# Patient Record
Sex: Female | Born: 1995 | Race: Black or African American | Hispanic: No | Marital: Single | Smoking: Never smoker
Health system: Southern US, Community
[De-identification: ages and names within clinical notes are randomized; demographics above are authoritative.]

## PROBLEM LIST (undated history)

## (undated) DIAGNOSIS — N83209 Unspecified ovarian cyst, unspecified side: Secondary | ICD-10-CM

## (undated) HISTORY — PX: NO PAST SURGERIES: SHX2092

---

## 2020-09-25 ENCOUNTER — Other Ambulatory Visit: Payer: Self-pay

## 2020-09-25 ENCOUNTER — Emergency Department
Admission: EM | Admit: 2020-09-25 | Discharge: 2020-09-25 | Disposition: A | Discharge status: Home / Self Care | Payer: Medicaid - Out of State | Attending: Emergency Medicine | Admitting: Emergency Medicine

## 2020-09-25 ENCOUNTER — Emergency Department: Payer: Medicaid - Out of State

## 2020-09-25 ENCOUNTER — Encounter: Payer: Self-pay | Admitting: Emergency Medicine

## 2020-09-25 DIAGNOSIS — R1031 Right lower quadrant pain: Secondary | ICD-10-CM | POA: Insufficient documentation

## 2020-09-25 DIAGNOSIS — O26892 Other specified pregnancy related conditions, second trimester: Secondary | ICD-10-CM | POA: Diagnosis present

## 2020-09-25 DIAGNOSIS — N898 Other specified noninflammatory disorders of vagina: Secondary | ICD-10-CM | POA: Diagnosis not present

## 2020-09-25 DIAGNOSIS — N939 Abnormal uterine and vaginal bleeding, unspecified: Secondary | ICD-10-CM

## 2020-09-25 DIAGNOSIS — Z3A08 8 weeks gestation of pregnancy: Secondary | ICD-10-CM | POA: Diagnosis not present

## 2020-09-25 DIAGNOSIS — R197 Diarrhea, unspecified: Secondary | ICD-10-CM | POA: Diagnosis not present

## 2020-09-25 DIAGNOSIS — Z3492 Encounter for supervision of normal pregnancy, unspecified, second trimester: Secondary | ICD-10-CM

## 2020-09-25 DIAGNOSIS — R102 Pelvic and perineal pain: Secondary | ICD-10-CM

## 2020-09-25 LAB — URINALYSIS, COMPLETE (UACMP) WITH MICROSCOPIC
Bacteria, UA: NONE SEEN
Bilirubin Urine: NEGATIVE
Glucose, UA: NEGATIVE mg/dL
Hgb urine dipstick: NEGATIVE
Ketones, ur: NEGATIVE mg/dL
Leukocytes,Ua: NEGATIVE
Nitrite: NEGATIVE
Protein, ur: NEGATIVE mg/dL
Specific Gravity, Urine: 1.02 (ref 1.005–1.030)
pH: 8 (ref 5.0–8.0)

## 2020-09-25 LAB — COMPREHENSIVE METABOLIC PANEL
ALT: 14 U/L (ref 0–44)
AST: 20 U/L (ref 15–41)
Albumin: 3.5 g/dL (ref 3.5–5.0)
Alkaline Phosphatase: 41 U/L (ref 38–126)
Anion gap: 9 (ref 5–15)
BUN: 7 mg/dL (ref 6–20)
CO2: 22 mmol/L (ref 22–32)
Calcium: 9.1 mg/dL (ref 8.9–10.3)
Chloride: 103 mmol/L (ref 98–111)
Creatinine, Ser: 0.36 mg/dL — ABNORMAL LOW (ref 0.44–1.00)
GFR, Estimated: >60 mL/min (ref >60)
Glucose, Bld: 94 mg/dL (ref 70–99)
Potassium: 3.7 mmol/L (ref 3.5–5.1)
Sodium: 134 mmol/L — ABNORMAL LOW (ref 135–145)
Total Bilirubin: 0.6 mg/dL (ref 0.3–1.2)
Total Protein: 6.8 g/dL (ref 6.5–8.1)

## 2020-09-25 LAB — CBC
HCT: 38.2 % (ref 36.0–46.0)
Hemoglobin: 13.2 g/dL (ref 12.0–15.0)
MCH: 29.7 pg (ref 26.0–34.0)
MCHC: 34.6 g/dL (ref 30.0–36.0)
MCV: 85.8 fL (ref 80.0–100.0)
Platelets: 194 10*3/uL (ref 150–400)
RBC: 4.45 MIL/uL (ref 3.87–5.11)
RDW: 13.2 % (ref 11.5–15.5)
WBC: 8.4 10*3/uL (ref 4.0–10.5)
nRBC: 0 % (ref 0.0–0.2)

## 2020-09-25 LAB — ABO/RH: ABO/RH(D): A POS

## 2020-09-25 LAB — HCG, QUANTITATIVE, PREGNANCY: hCG, Beta Chain, Quant, S: 34186 m[IU]/mL — ABNORMAL HIGH (ref <5)

## 2020-09-25 LAB — LIPASE, BLOOD: Lipase: 27 U/L (ref 11–51)

## 2020-09-25 NOTE — ED Triage Notes (Signed)
Pt presents to ED via POV with c/o RLQ abdominal pain, vaginal spotting, and diarrhea. Pt states is currently pregnant, due 02/26/21 approx [redacted] weeks pregnant. Pt states symptoms started at approx 0600 this morning, abdominal cramping x several days. Pt states is G3A2.

## 2020-09-25 NOTE — ED Provider Notes (Addendum)
[facility] Emergency Department Provider Note  ____________________________________________  Time seen: Approximately 3:01 PM  I have reviewed the triage vital signs and the nursing notes.   HISTORY  Chief Complaint Abdominal Pain and Diarrhea    HPI Angelica Mason is a 24 y.o. female that is [redacted] weeks pregnant that presents to the emergency department for evaluation of diarrhea, vaginal spotting, and right lower quadrant cramping today. States that she has had 5-6 back-to-back loose diarrhea today.  She had one episode of vaginal spotting this morning.  She has not noticed any vaginal spotting this afternoon.  She has had some right side cramping.  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  She moved here last week from [place] and has not yet established with OB here.  She has had routine follow-up with her OB in [place] prior to moving.  She has had no complications with this pregnancy. No fever, nausea, vomiting.   History reviewed. No pertinent past medical history.  There are no problems to display for this patient.   History reviewed. No pertinent surgical history.  Prior to Admission medications   Not on File    Allergies Toradol [ketorolac tromethamine]  No family history on file.  Social History Social History   Tobacco Use  . Smoking status: Not on file  . Smokeless tobacco: Never Used  Substance Use Topics  . Alcohol use: Not Currently  . Drug use: Not Currently     Review of Systems  Constitutional: No fever/chills ENT: No upper respiratory complaints. Cardiovascular: No chest pain. Respiratory: No cough. No SOB. Gastrointestinal: Positive for abdominal pain. No nausea, no vomiting.  Musculoskeletal: Negative for musculoskeletal pain. Skin: Negative for rash, abrasions, lacerations, ecchymosis. Neurological: Negative for headaches, numbness or tingling   ____________________________________________   PHYSICAL  EXAM:  VITAL SIGNS: ED Triage Vitals  Enc Vitals Group     BP 09/25/20 1225 122/67     Pulse Rate 09/25/20 1225 81     Resp 09/25/20 1225 20     Temp 09/25/20 1225 97.9 F (36.6 C)     Temp src --      SpO2 09/25/20 1225 100 %     Weight 09/25/20 1226 156 lb (70.8 kg)     Height 09/25/20 1226 5\' 3"  (1.6 m)     Head Circumference --      Peak Flow --      Pain Score 09/25/20 1226 8     Pain Loc --      Pain Edu? --      Excl. in GC? --      Constitutional: Alert and oriented. Well appearing and in no acute distress. Eyes: Conjunctivae are normal. PERRL. EOMI. Head: Atraumatic. ENT:      Ears:      Nose: No congestion/rhinnorhea.      Mouth/Throat: Mucous membranes are moist.  Neck: No stridor.   Cardiovascular: Normal rate, regular rhythm.  Good peripheral circulation. Respiratory: Normal respiratory effort without tachypnea or retractions. Lungs CTAB. Good air entry to the bases with no decreased or absent breath sounds. Gastrointestinal: Bowel sounds 4 quadrants. Soft and nontender to palpation. No guarding or rigidity. No palpable masses. No distention.  Musculoskeletal: Full range of motion to all extremities. No gross deformities appreciated. Neurologic:  Normal speech and language. No gross focal neurologic deficits are appreciated.  Skin:  Skin is warm, dry and intact. No rash noted. Psychiatric: Mood and affect are normal. Speech and behavior  are normal. Patient exhibits appropriate insight and judgement.   ____________________________________________   LABS (all labs ordered are listed, but only abnormal results are displayed)  Labs Reviewed  COMPREHENSIVE METABOLIC PANEL - Abnormal; Notable for the following components:      Result Value   Sodium 134 (*)    Creatinine, Ser 0.36 (*)    All other components within normal limits  URINALYSIS, COMPLETE (UACMP) WITH MICROSCOPIC - Abnormal; Notable for the following components:   Color, Urine YELLOW (*)     APPearance CLOUDY (*)    All other components within normal limits  HCG, QUANTITATIVE, PREGNANCY - Abnormal; Notable for the following components:   hCG, Beta Chain, Quant, S 34,186 (*)    All other components within normal limits  LIPASE, BLOOD  CBC  ABO/RH   ____________________________________________  EKG   ____________________________________________  RADIOLOGY   US OB Limited  Result Date: 09/25/2020 CLINICAL DATA:  RIGHT lower quadrant pain and spotting for 1 week, in second trimester pregnancy with assigned gestational age of [redacted] weeks 0 days EXAM: LIMITED OBSTETRIC ULTRASOUND FINDINGS: Number of Fetuses: 1 Heart Rate:  144 bpm Movement: Yes Presentation: Variable Placental Location: Fundal to RIGHT Previa: None Amniotic Fluid (Subjective):  Within normal limits. BPD: 4.0 cm 18 w  1 d MATERNAL FINDINGS: Cervix:  Appears closed, 4.0 cm length. Uterus/Adnexae: No abnormality visualized. IMPRESSION: Single live intrauterine gestation as above. No acute abnormalities. This exam is performed on an emergent basis and does not comprehensively evaluate fetal size, dating, or anatomy; follow-up complete OB US should be considered if further fetal assessment is warranted. Electronically Signed   By: Ulyses Southward M.D.   On: 09/25/2020 16:16    ____________________________________________    PROCEDURES  Procedure(s) performed:    Procedures    Medications - No data to display   ____________________________________________   INITIAL IMPRESSION / ASSESSMENT AND PLAN / ED COURSE  Pertinent labs & imaging results that were available during my care of the patient were reviewed by me and considered in my medical decision making (see chart for details).  Review of the [place] CSRS was performed in accordance of the [facility] prior to dispensing any controlled drugs.   Patient presented the emergency department for evaluation of diarrhea, vaginal spotting, abdominal cramping during  pregnancy.  Vital signs and exam are reassuring.  Patient had one episode of vaginal spotting this morning and has not had any birth or vaginal spotting.  Her abdominal pain has significantly improved while in the emergency department.  Lab work is largely unremarkable.  Ultrasound negative for acute abnormalities per radiology.  Patient overall feels well now. Patient is to follow up with OB as directed.  OB referral was given.  Patient is given ED precautions to return to the ED for any worsening or new symptoms.   MARVELLE SPAN was evaluated in Emergency Department on 09/25/2020 for the symptoms described in the history of present illness. She was evaluated in the context of the global COVID-19 pandemic, which necessitated consideration that the patient might be at risk for infection with the SARS-CoV-2 virus that causes COVID-19. Institutional protocols and algorithms that pertain to the evaluation of patients at risk for COVID-19 are in a state of rapid change based on information released by regulatory bodies including the CDC and federal and state organizations. These policies and algorithms were followed during the patient's care in the ED.  ____________________________________________  FINAL CLINICAL IMPRESSION(S) / ED DIAGNOSES  Final diagnoses:  Right  lower quadrant abdominal pain  Second trimester pregnancy  Vaginal spotting      NEW MEDICATIONS STARTED DURING THIS VISIT:  ED Discharge Orders    None          This chart was dictated using voice recognition software/Dragon. Despite best efforts to proofread, errors can occur which can change the meaning. Any change was purely unintentional.    Enid Derry, PA-C 09/26/20 2300    Enid Derry, PA-C 09/26/20 2301    Gilles Chiquito, MD 09/27/20 [phone]

## 2020-10-25 ENCOUNTER — Other Ambulatory Visit: Payer: Self-pay

## 2020-10-25 ENCOUNTER — Ambulatory Visit: Payer: Self-pay | Admitting: Physician Assistant

## 2020-10-25 DIAGNOSIS — B373 Candidiasis of vulva and vagina: Secondary | ICD-10-CM

## 2020-10-25 DIAGNOSIS — Z113 Encounter for screening for infections with a predominantly sexual mode of transmission: Secondary | ICD-10-CM

## 2020-10-25 DIAGNOSIS — B3731 Acute candidiasis of vulva and vagina: Secondary | ICD-10-CM

## 2020-10-25 LAB — WET PREP FOR TRICH, YEAST, CLUE: Trichomonas Exam: NEGATIVE

## 2020-10-25 MED ORDER — CLOTRIMAZOLE 1 % VA CREA: 1.0000 | TOPICAL_CREAM | Freq: Every day | VAGINAL | 0 refills | Status: DC

## 2020-10-25 NOTE — Progress Notes (Signed)
Post:  RN reviewed wet mount with patient. Patient tx for yeast per C. Hampton PA orders.   Harvie Heck, RN

## 2020-10-26 ENCOUNTER — Encounter: Payer: Self-pay | Admitting: Physician Assistant

## 2020-10-26 NOTE — Progress Notes (Signed)
[facility] Department STI clinic/screening visit  Subjective:  Angelica Mason is a 24 y.o. female being seen today for an STI screening visit. The patient reports they do have symptoms.  Patient reports that they are currently [redacted] weeks pregnant.   They reported they are not interested in discussing contraception today.  Patient's last menstrual period was 05/22/2020.   Patient has the following medical conditions:  There are no problems to display for this patient.   Chief Complaint  Patient presents with  . SEXUALLY TRANSMITTED DISEASE    screening    HPI  Patient reports that she has had light greenish/yellowish discharge with itching for 2 weeks.  Denies chronic conditions and surgeries.  Reports that she recently moved here from [place] and is trying to get her Medicaid straightened out to start [facility].  Reports that she is taking her PNV as directed.  States her last HIV test was last month and last pap was in August.  Patient given resource list for [facility] providers in the area and declines to schedule at [facility] today.   See flowsheet for further details and programmatic requirements.    The following portions of the patient's history were reviewed and updated as appropriate: allergies, current medications, past medical history, past social history, past surgical history and problem list.  Objective:  There were no vitals filed for this visit.  Physical Exam Constitutional:      General: She is not in acute distress.    Appearance: Normal appearance.  HENT:     Head: Normocephalic and atraumatic.     Comments: No nits,lice, or hair loss. No cervical, supraclavicular or axillary adenopathy.    Mouth/Throat:     Mouth: Mucous membranes are moist.     Pharynx: Oropharynx is clear. No oropharyngeal exudate or posterior oropharyngeal erythema.  Eyes:     Conjunctiva/sclera: Conjunctivae normal.  Pulmonary:     Effort: Pulmonary effort is normal.  Abdominal:      Palpations: Abdomen is soft. There is no mass.     Tenderness: There is no abdominal tenderness. There is no guarding or rebound.  Genitourinary:    General: Normal vulva.     Rectum: Normal.     Comments: External genitalia/pubic area without nits, lice, edema, erythema, lesions and inguinal adenopathy. Vagina with normal mucosa and moderate amount of thick, clumping  discharge. Cervix without visible lesions. Uterus firm, mobile, nt, approximately 22-24 week size, no masses, no CMT, no adnexal tenderness or fullness. Musculoskeletal:     Cervical back: Neck supple. No tenderness.  Skin:    General: Skin is warm and dry.     Findings: No bruising, erythema, lesion or rash.  Neurological:     Mental Status: She is alert and oriented to person, place, and time.  Psychiatric:        Mood and Affect: Mood normal.        Behavior: Behavior normal.        Thought Content: Thought content normal.        Judgment: Judgment normal.      Assessment and Plan:  Angelica Mason is a 24 y.o. female presenting to the [facility] Department for STI screening  1. Screening for STD (sexually transmitted disease) Patient into clinic with symptoms. Patient declines blood work today. Rec condoms with all sex. Await test results.  Counseled that RN will call if needs to RTC for treatment once results are back. - WET PREP FOR TRICH,  YEAST, CLUE - Chlamydia/Gonorrhea [facility]  2. Vaginal candidiasis Will treat for yeast with Clotrimazole 1 % vaginal cream 1 app per vagina at hs for 7 days. No sex for 10 days. - clotrimazole (CLOTRIMAZOLE-7) 1 % vaginal cream; Place 1 Applicatorful vaginally at bedtime.  Dispense: 45 g; Refill: 0     No follow-ups on file.  No future appointments.  Matt Holmes, PA

## 2020-11-07 DIAGNOSIS — Z419 Encounter for procedure for purposes other than remedying health state, unspecified: Secondary | ICD-10-CM | POA: Diagnosis not present

## 2020-11-13 DIAGNOSIS — Z3482 Encounter for supervision of other normal pregnancy, second trimester: Secondary | ICD-10-CM | POA: Diagnosis not present

## 2020-11-13 DIAGNOSIS — N898 Other specified noninflammatory disorders of vagina: Secondary | ICD-10-CM | POA: Diagnosis not present

## 2020-11-20 DIAGNOSIS — Z3482 Encounter for supervision of other normal pregnancy, second trimester: Secondary | ICD-10-CM | POA: Diagnosis not present

## 2020-11-28 DIAGNOSIS — Z20822 Contact with and (suspected) exposure to covid-19: Secondary | ICD-10-CM | POA: Diagnosis not present

## 2020-12-04 DIAGNOSIS — Z3482 Encounter for supervision of other normal pregnancy, second trimester: Secondary | ICD-10-CM | POA: Diagnosis not present

## 2020-12-08 DIAGNOSIS — Z419 Encounter for procedure for purposes other than remedying health state, unspecified: Secondary | ICD-10-CM | POA: Diagnosis not present

## 2020-12-08 NOTE — L&D Delivery Note (Signed)
Delivery Summary for Angelica Mason  Labor Events:   Preterm labor: No data found  Rupture date: 02/25/2021  Rupture time: 9:50 PM  Rupture type: Spontaneous Intact Bulging bag of water  Fluid Color: Clear Yellow Light Meconium  Induction: No data found  Augmentation: No data found  Complications: No data found  Cervical ripening: No data found No data found   No data found     Delivery:   Episiotomy: No data found  Lacerations: No data found  Repair suture: No data found  Repair # of packets: No data found  Blood loss (ml): No data found   Information for the patient's newborn:  Angelica, Mason [[id number]]    Delivery 02/26/2021 5:56 AM by  C-Section, Low Transverse Sex:  female Gestational Age: [redacted]w[redacted]d Delivery Clinician:   Living?:         APGARS  One minute Five minutes Ten minutes  Skin color:        Heart rate:        Grimace:        Muscle tone:        Breathing:        Totals: 8  9      Presentation/position:      Resuscitation:   Cord information:    Disposition of cord blood:     Blood gases sent?  Complications:   Placenta: Delivered:       appearance Newborn Measurements: Weight: 7 lb 4.8 oz (3310 g)  Height: 21.65"  Head circumference:    Chest circumference:    Other providers:    Additional  information: Forceps:   Vacuum:   Breech:   Observed anomalies        See Dr. Oretha Milch operative note for details of C-section delivery.    Hildred Laser, MD [facility]

## 2020-12-18 DIAGNOSIS — Z20822 Contact with and (suspected) exposure to covid-19: Secondary | ICD-10-CM | POA: Diagnosis not present

## 2020-12-22 ENCOUNTER — Observation Stay
Admission: EM | Admit: 2020-12-22 | Discharge: 2020-12-22 | Disposition: A | Discharge status: Home / Self Care | Payer: Medicaid Other | Attending: Obstetrics and Gynecology | Admitting: Obstetrics and Gynecology

## 2020-12-22 ENCOUNTER — Other Ambulatory Visit: Payer: Self-pay

## 2020-12-22 DIAGNOSIS — R1031 Right lower quadrant pain: Secondary | ICD-10-CM | POA: Insufficient documentation

## 2020-12-22 DIAGNOSIS — Z6830 Body mass index (BMI) 30.0-30.9, adult: Secondary | ICD-10-CM | POA: Diagnosis not present

## 2020-12-22 DIAGNOSIS — Z3A30 30 weeks gestation of pregnancy: Secondary | ICD-10-CM | POA: Insufficient documentation

## 2020-12-22 DIAGNOSIS — O26893 Other specified pregnancy related conditions, third trimester: Secondary | ICD-10-CM | POA: Diagnosis not present

## 2020-12-22 DIAGNOSIS — O2621 Pregnancy care for patient with recurrent pregnancy loss, first trimester: Secondary | ICD-10-CM | POA: Diagnosis not present

## 2020-12-22 DIAGNOSIS — O99891 Other specified diseases and conditions complicating pregnancy: Secondary | ICD-10-CM | POA: Diagnosis not present

## 2020-12-22 HISTORY — DX: Unspecified ovarian cyst, unspecified side: N83.209

## 2020-12-22 MED ORDER — ACETAMINOPHEN 325 MG PO TABS
ORAL_TABLET | ORAL | Status: AC
  Filled 2020-12-22: qty 2

## 2020-12-22 MED ORDER — ACETAMINOPHEN 325 MG PO TABS
650.0000 mg | ORAL_TABLET | Freq: Four times a day (QID) | ORAL | Status: DC | PRN
  Administered 2020-12-22: 650 mg via ORAL

## 2020-12-22 MED ORDER — SIMETHICONE 80 MG PO CHEW
80.0000 mg | CHEWABLE_TABLET | Freq: Once | ORAL | Status: AC
  Administered 2020-12-22: 80 mg via ORAL

## 2020-12-22 MED ORDER — SIMETHICONE 80 MG PO CHEW
CHEWABLE_TABLET | ORAL | Status: AC
  Filled 2020-12-22: qty 1

## 2020-12-22 NOTE — Discharge Summary (Signed)
Patient ID: Angelica Mason MRN: [id number] DOB/AGE: 1995-12-26 25 y.o.  Admit date: 12/22/2020 Discharge date: 12/22/2020  Admission Diagnoses: RLQ abdominal pain, possibly gas vs RL pain  1. Transfer of care at [redacted]w[redacted]d 2. SAB x2    Discharge Diagnoses: Likely RL pain reduced  Prenatal Procedures: NST  Consults: none  Significant Diagnostic Studies:  No results found for this or any previous visit (from the past 168 hour(s)).  Treatments: Medication  Hospital Course:  This is a 25 y.o. G3P0020 with IUP at [redacted]w[redacted]d seen for sudden right lower abdominal pain.  Denies leaking of fluid or bleeding.  Reports GFM.  She was observed, fetal heart rate monitoring remained reassuring, and she had no signs/symptoms of preterm labor or other maternal-fetal concerns.  She was deemed stable for discharge to home with outpatient follow up.  Discharge Physical Exam:  BP 113/65 (BP Location: Right Arm)   Pulse 78   Temp 98.5 F (36.9 C) (Oral)   Resp 20   Ht 5\' 3"  (1.6 m)   Wt 81.6 kg   LMP 05/22/2020   BMI 31.89 kg/m   General: NAD CV: RRR Pulm: nl effort ABD: s/nd/nt, gravid DVT Evaluation: LE non-ttp, no evidence of DVT on exam.  NST: FHR baseline: 140 bpm Variability: moderate Accelerations: yes Decelerations: none Time: 30 minutes Category/reactivity: reactive   TOCO: quiet SVE: deferred      Discharge Condition: Stable  Disposition: Discharge disposition: 01-Home or Self Care        Allergies as of 12/22/2020      Reactions   Toradol [ketorolac Tromethamine] Itching      Medication List    STOP taking these medications   clotrimazole 1 % vaginal cream Commonly known as: Clotrimazole-7     TAKE these medications   aspirin 325 MG EC tablet Take 325 mg by mouth daily.   metoCLOPramide 10 MG tablet Commonly known as: REGLAN Take 10 mg by mouth 4 (four) times daily.   prenatal multivitamin Tabs tablet Take 1 tablet by mouth daily at 12 noon.    promethazine 12.5 MG tablet Commonly known as: PHENERGAN Take 12.5 mg by mouth every 6 (six) hours as needed for nausea or vomiting.        Signed1/17/2022, CNM 12/22/2020 4:53 AM

## 2020-12-22 NOTE — Discharge Instructions (Signed)

## 2020-12-22 NOTE — OB Triage Note (Signed)
Pt G1P0 [redacted]w[redacted]d presents to [facility] through ED w/ c/o constant sharp right abd pain that started about 2 hrs ago, rating 8/10 rn. Reports no vag bleeding, no LOF, and positive FM. VSS. Monitors applied and assessing.

## 2020-12-22 NOTE — OB Triage Note (Signed)
Pt discharged in good condition, pain improved w/ tylenol. No vag bleeding, no LOF, +FM. Pt educated on RL pain, discharge instructions given by RN, teach back method used.

## 2021-01-01 DIAGNOSIS — Z3483 Encounter for supervision of other normal pregnancy, third trimester: Secondary | ICD-10-CM | POA: Diagnosis not present

## 2021-01-04 ENCOUNTER — Telehealth: Payer: Self-pay

## 2021-01-04 ENCOUNTER — Encounter: Payer: Medicaid Other | Admitting: Certified Nurse Midwife

## 2021-01-04 ENCOUNTER — Encounter: Payer: Self-pay | Admitting: Certified Nurse Midwife

## 2021-01-04 ENCOUNTER — Other Ambulatory Visit: Payer: Self-pay

## 2021-01-04 ENCOUNTER — Ambulatory Visit (INDEPENDENT_AMBULATORY_CARE_PROVIDER_SITE_OTHER): Payer: Medicaid Other | Admitting: Certified Nurse Midwife

## 2021-01-04 VITALS — BP 114/87 | HR 96 | Wt 188.2 lb

## 2021-01-04 DIAGNOSIS — Z3480 Encounter for supervision of other normal pregnancy, unspecified trimester: Secondary | ICD-10-CM

## 2021-01-04 DIAGNOSIS — Z8616 Personal history of COVID-19: Secondary | ICD-10-CM

## 2021-01-04 DIAGNOSIS — Z6710 Type A blood, Rh positive: Secondary | ICD-10-CM | POA: Insufficient documentation

## 2021-01-04 DIAGNOSIS — Z3A32 32 weeks gestation of pregnancy: Secondary | ICD-10-CM

## 2021-01-04 LAB — POCT URINALYSIS DIPSTICK OB
Bilirubin, UA: NEGATIVE
Blood, UA: NEGATIVE
Glucose, UA: NEGATIVE
Ketones, UA: NEGATIVE
Nitrite, UA: NEGATIVE
POC,PROTEIN,UA: NEGATIVE
Spec Grav, UA: 1.01 (ref 1.010–1.025)
Urobilinogen, UA: 0.2 E.U./dL
pH, UA: 6.5 (ref 5.0–8.0)

## 2021-01-04 NOTE — Progress Notes (Signed)
I have seen, interviewed, and examined the patient in conjunction with the [facility] and affirm the diagnosis and management plan.   Gunnar Bulla, CNM [facility], [facility] 01/04/21 11:44 AM

## 2021-01-04 NOTE — Telephone Encounter (Signed)
Please complete. Thanks, JML

## 2021-01-04 NOTE — Telephone Encounter (Signed)
Called pt and Left voice mail. Informed the pt to please call the office. The paperwork that was left today with me is a form of FMLA. At our office in order for the paperwork to be completed the FMLA form fee has to be paid the fee is $25. I told the pt I can take the payment over the phone or she can come in to pay.

## 2021-01-04 NOTE — Patient Instructions (Signed)
Back Pain in Pregnancy Back pain during pregnancy is common. Back pain may be caused by several factors that are related to changes during your pregnancy. Follow these instructions at home: Managing pain, stiffness, and swelling  If directed, for sudden (acute) back pain, put ice on the painful area. ? Put ice in a plastic bag. ? Place a towel between your skin and the bag. ? Leave the ice on for 20 minutes, 2-3 times per day.  If directed, apply heat to the affected area before you exercise. Use the heat source that your health care provider recommends, such as a moist heat pack or a heating pad. ? Place a towel between your skin and the heat source. ? Leave the heat on for 20-30 minutes. ? Remove the heat if your skin turns bright red. This is especially important if you are unable to feel pain, heat, or cold. You may have a greater risk of getting burned.  If directed, massage the affected area.      Activity  Exercise as told by your health care provider. Gentle exercise is the best way to prevent or manage back pain.  Listen to your body when lifting. If lifting hurts, ask for help or bend your knees. This uses your leg muscles instead of your back muscles.  Squat down when picking up something from the floor. Do not bend over.  Only use bed rest for short periods as told by your health care provider. Bed rest should only be used for the most severe episodes of back pain. Standing, sitting, and lying down  Do not stand in one place for long periods of time.  Use good posture when sitting. Make sure your head rests over your shoulders and is not hanging forward. Use a pillow on your lower back if necessary.  Try sleeping on your side, preferably the left side, with a pregnancy support pillow or 1-2 regular pillows between your legs. ? If you have back pain after a night's rest, your bed may be too soft. ? A firm mattress may provide more support for your back during  pregnancy. General instructions  Do not wear high heels.  Eat a healthy diet. Try to gain weight within your health care provider's recommendations.  Use a maternity girdle, elastic sling, or back brace as told by your health care provider.  Take over-the-counter and prescription medicines only as told by your health care provider.  Work with a physical therapist or massage therapist to find ways to manage back pain. Acupuncture or massage therapy may be helpful.  Keep all follow-up visits as told by your health care provider. This is important. Contact a health care provider if:  Your back pain interferes with your daily activities.  You have increasing pain in other parts of your body. Get help right away if:  You develop numbness, tingling, weakness, or problems with the use of your arms or legs.  You develop severe back pain that is not controlled with medicine.  You have a change in bowel or bladder control.  You develop shortness of breath, dizziness, or you faint.  You develop nausea, vomiting, or sweating.  You have back pain that is a rhythmic, cramping pain similar to labor pains. Labor pain is usually 1-2 minutes apart, lasts for about 1 minute, and involves a bearing down feeling or pressure in your pelvis.  You have back pain and your water breaks or you have vaginal bleeding.  You have back pain or  numbness that travels down your leg.  Your back pain developed after you fell.  You develop pain on one side of your back.  You see blood in your urine.  You develop skin blisters in the area of your back pain. Summary  Back pain may be caused by several factors that are related to changes during your pregnancy.  Follow instructions as told by your health care provider for managing pain, stiffness, and swelling.  Exercise as told by your health care provider. Gentle exercise is the best way to prevent or manage back pain.  Take over-the-counter and  prescription medicines only as told by your health care provider.  Keep all follow-up visits as told by your health care provider. This is important. This information is not intended to replace advice given to you by your health care provider. Make sure you discuss any questions you have with your health care provider. Document Revised: 03/15/2019 Document Reviewed: 05/12/2018 Elsevier Patient Education  2021 Elsevier Inc.    Round Ligament Pain  The round ligament is a cord of muscle and tissue that helps support the uterus. It can become a source of pain during pregnancy if it becomes stretched or twisted as the baby grows. The pain usually begins in the second trimester (13-28 weeks) of pregnancy, and it can come and go until the baby is delivered. It is not a serious problem, and it does not cause harm to the baby. Round ligament pain is usually a short, sharp, and pinching pain, but it can also be a dull, lingering, and aching pain. The pain is felt in the lower side of the abdomen or in the groin. It usually starts deep in the groin and moves up to the outside of the hip area. The pain may occur when you:  Suddenly change position, such as quickly going from a sitting to standing position.  Roll over in bed.  Cough or sneeze.  Do physical activity. Follow these instructions at home:  Watch your condition for any changes.  When the pain starts, relax. Then try any of these methods to help with the pain: ? Sitting down. ? Flexing your knees up to your abdomen. ? Lying on your side with one pillow under your abdomen and another pillow between your legs. ? Sitting in a warm bath for 15-20 minutes or until the pain goes away.  Take over-the-counter and prescription medicines only as told by your health care provider.  Move slowly when you sit down or stand up.  Avoid long walks if they cause pain.  Stop or reduce your physical activities if they cause pain.  Keep all  follow-up visits as told by your health care provider. This is important.   Contact a health care provider if:  Your pain does not go away with treatment.  You feel pain in your back that you did not have before.  Your medicine is not helping. Get help right away if:  You have a fever or chills.  You develop uterine contractions.  You have vaginal bleeding.  You have nausea or vomiting.  You have diarrhea.  You have pain when you urinate. Summary  Round ligament pain is felt in the lower abdomen or groin. It is usually a short, sharp, and pinching pain. It can also be a dull, lingering, and aching pain.  This pain usually begins in the second trimester (13-28 weeks). It occurs because the uterus is stretching with the growing baby, and it is not  harmful to the baby.  You may notice the pain when you suddenly change position, when you cough or sneeze, or during physical activity.  Relaxing, flexing your knees to your abdomen, lying on one side, or taking a warm bath may help to get rid of the pain.  Get help from your health care provider if the pain does not go away or if you have vaginal bleeding, nausea, vomiting, diarrhea, or painful urination. This information is not intended to replace advice given to you by your health care provider. Make sure you discuss any questions you have with your health care provider. Document Revised: 05/12/2018 Document Reviewed: 05/12/2018 Elsevier Patient Education  2021 [employer].    Third Trimester of Pregnancy  The third trimester of pregnancy is from week 28 through week 40. This is also called months 7 through 9. This trimester is when your unborn baby (fetus) is growing very fast. At the end of the ninth month, the unborn baby is about 20 inches long. It weighs about 6-10 pounds. Body changes during your third trimester Your body continues to go through many changes during this time. The changes vary and generally return to normal  after the baby is born. Physical changes  Your weight will continue to increase. You may gain 25-35 pounds (11-16 kg) by the end of the pregnancy. If you are underweight, you may gain 28-40 lb (about 13-18 kg). If you are overweight, you may gain 15-25 lb (about 7-11 kg).  You may start to get stretch marks on your hips, belly (abdomen), and breasts.  Your breasts will continue to grow and may hurt. A yellow fluid (colostrum) may leak from your breasts. This is the first milk you are making for your baby.  You may have changes in your hair.  Your belly button may stick out.  You may have more swelling in your hands, face, or ankles. Health changes  You may have heartburn.  You may have trouble pooping (constipation).  You may get hemorrhoids. These are swollen veins in the butt that can itch or get painful.  You may have swollen veins (varicose veins) in your legs.  You may have more body aches in the pelvis, back, or thighs.  You may have more tingling or numbness in your hands, arms, and legs. The skin on your belly may also feel numb.  You may feel short of breath as your womb (uterus) gets bigger. Other changes  You may pee (urinate) more often.  You may have more problems sleeping.  You may notice the unborn baby "dropping," or moving lower in your belly.  You may have more discharge coming from your vagina.  Your joints may feel loose, and you may have pain around your pelvic bone. Follow these instructions at home: Medicines  Take over-the-counter and prescription medicines only as told by your doctor. Some medicines are not safe during pregnancy.  Take a prenatal vitamin that contains at least 600 micrograms (mcg) of folic acid. Eating and drinking  Eat healthy meals that include: ? Fresh fruits and vegetables. ? Whole grains. ? Good sources of protein, such as meat, eggs, or tofu. ? Low-fat dairy products.  Avoid raw meat and unpasteurized juice, milk,  and cheese. These carry germs that can harm you and your baby.  Eat 4 or 5 small meals rather than 3 large meals a day.  You may need to take these actions to prevent or treat trouble pooping: ? Drink enough fluids to  keep your pee (urine) pale yellow. ? Eat foods that are high in fiber. These include beans, whole grains, and fresh fruits and vegetables. ? Limit foods that are high in fat and sugar. These include fried or sweet foods. Activity  Exercise only as told by your doctor. Stop exercising if you start to have cramps in your womb.  Avoid heavy lifting.  Do not exercise if it is too hot or too humid, or if you are in a place of great height (high altitude).  If you choose to, you may have sex unless your doctor tells you not to. Relieving pain and discomfort  Take breaks often, and rest with your legs raised (elevated) if you have leg cramps or low back pain.  Take warm water baths (sitz baths) to soothe pain or discomfort caused by hemorrhoids. Use hemorrhoid cream if your doctor approves.  Wear a good support bra if your breasts are tender.  If you develop bulging, swollen veins in your legs: ? Wear support hose as told by your doctor. ? Raise your feet for 15 minutes, 3-4 times a day. ? Limit salt in your food. Safety  Talk to your doctor before traveling far distances.  Do not use hot tubs, steam rooms, or saunas.  Wear your seat belt at all times when you are in a car.  Talk with your doctor if someone is hurting you or yelling at you a lot. Preparing for your baby's arrival To prepare for the arrival of your baby:  Take prenatal classes.  Visit the hospital and tour the maternity area.  Buy a rear-facing car seat. Learn how to install it in your car.  Prepare the baby's room. Take out all pillows and stuffed animals from the baby's crib. General instructions  Avoid cat litter boxes and soil used by cats. These carry germs that can cause harm to the baby  and can cause a loss of your baby by miscarriage or stillbirth.  Do not douche or use tampons. Do not use scented sanitary pads.  Do not smoke or use any products that contain nicotine or tobacco. If you need help quitting, ask your doctor.  Do not drink alcohol.  Do not use herbal medicines, illegal drugs, or medicines that were not approved by your doctor. Chemicals in these products can affect your baby.  Keep all follow-up visits. This is important. Where to find more information  American Pregnancy Association: americanpregnancy.org  [employer]: www.acog.org  Office on Women's Health: Might[employer]Rew[employer].co.nz Contact a doctor if:  You have a fever.  You have mild cramps or pressure in your lower belly.  You have a nagging pain in your belly area.  You vomit, or you have watery poop (diarrhea).  You have bad-smelling fluid coming from your vagina.  You have pain when you pee, or your pee smells bad.  You have a headache that does not go away when you take medicine.  You have changes in how you see, or you see spots in front of your eyes. Get help right away if:  Your water breaks.  You have regular contractions that are less than 5 minutes apart.  You are spotting or bleeding from your vagina.  You have very bad belly cramps or pain.  You have trouble breathing.  You have chest pain.  You faint.  You have not felt the baby move for the amount of time told by your doctor.  You have new or increased  pain, swelling, or redness in an arm or leg. Summary  The third trimester is from week 28 through week 40 (months 7 through 9). This is the time when your unborn baby is growing very fast.  During this time, your discomfort may increase as you gain weight and as your baby grows.  Get ready for your baby to arrive by taking prenatal classes, buying a rear-facing car seat, and preparing the baby's room.  Get help  right away if you are bleeding from your vagina, you have chest pain and trouble breathing, or you have not felt the baby move for the amount of time told by your doctor. This information is not intended to replace advice given to you by your health care provider. Make sure you discuss any questions you have with your health care provider. Document Revised: 05/02/2020 Document Reviewed: 03/08/2020 Elsevier Patient Education  2021 [employer].

## 2021-01-04 NOTE — Progress Notes (Signed)
TRANSFER IN OB HISTORY AND PHYSICAL  SUBJECTIVE:       Angelica Mason is a 25 y.o. [id number] female, Patient's last menstrual period was 05/22/2020., Estimated Date of Delivery: 02/26/21, [redacted]w[redacted]d, presents today for Transition of Prenatal Care. EPIC data migration from outside records is accomplished today.  Reports nausea with occasional vomiting, lower back pain and intermittent round ligament pain.   Desires waterbirth, prefers midiwferty care.   Denies breathing difficulty, respiratory distress, chest pain, vaginal bleeding, and leg pain.    Gynecologic History  Patient's last menstrual period was 05/22/2020.   Contraception: none  Last Pap: 08/2020. Results were: Normal, history of abnormal  Obstetric History  OB History  Gravida Para Term Preterm AB Living  3       2    SAB IAB Ectopic Multiple Live Births               # Outcome Date GA Lbr Len/2nd Weight Sex Delivery Anes PTL Lv  3 Current           2 AB           1 AB             Past Medical History:  Diagnosis Date  . Ovarian cyst     Past Surgical History:  Procedure Laterality Date  . NO PAST SURGERIES      Current Outpatient Medications on File Prior to Visit  Medication Sig Dispense Refill  . aspirin 325 MG EC tablet Take 325 mg by mouth daily.    . metoCLOPramide (REGLAN) 10 MG tablet Take 10 mg by mouth 4 (four) times daily.    . Prenatal Vit-Fe Fumarate-FA (PRENATAL MULTIVITAMIN) TABS tablet Take 1 tablet by mouth daily at 12 noon.    . promethazine (PHENERGAN) 12.5 MG tablet Take 12.5 mg by mouth every 6 (six) hours as needed for nausea or vomiting.     No current facility-administered medications on file prior to visit.    Allergies  Allergen Reactions  . Toradol [Ketorolac Tromethamine] Itching    Social History   Socioeconomic History  . Marital status: Single    Spouse name: Not on file  . Number of children: Not on file  . Years of education: Not on file  . Highest education  level: Not on file  Occupational History  . Not on file  Tobacco Use  . Smoking status: Never Smoker  . Smokeless tobacco: Never Used  Substance and Sexual Activity  . Alcohol use: Not Currently  . Drug use: Not Currently  . Sexual activity: Yes    Partners: Male  Other Topics Concern  . Not on file  Social History Narrative  . Not on file   Social Determinants of Health   Financial Resource Strain: Not on file  Food Insecurity: Not on file  Transportation Needs: Not on file  Physical Activity: Not on file  Stress: Not on file  Social Connections: Not on file  Intimate Partner Violence: Not on file    No family history on file.  The following portions of the patient's history were reviewed and updated as appropriate: allergies, current medications, past OB history, past medical history, past surgical history, past family history, past social history, and problem list.  Review of Systems:  ROS - Negative other than what was reported above. Information obtained verbally from patient.  OBJECTIVE:  BP 114/87   Pulse 96   Wt 188 lb 3.2 oz (85.4 kg)  LMP 05/22/2020   BMI 33.34 kg/m   Initial Physical Exam (New OB)  GENERAL APPEARANCE: alert, well appearing, in no apparent distress, oriented to person, place and time  HEAD: normocephalic, atraumatic  BREASTS: patient declined exam  LUNGS: clear to auscultation, no wheezes, rales or rhonchi, symmetric air entry  HEART: regular rate and rhythm, no murmurs  ABDOMEN: fundus soft, nontender 32 cm and FHT present  EXTREMITIES: no redness or tenderness in the calves or thighs, no edema  SKIN: normal coloration and turgor, no rashes  NEUROLOGIC: alert, oriented, normal speech, no focal findings or movement disorder noted  PELVIC EXAM: Exam deferred   ASSESSMENT:  Supervision other normal pregnancy [redacted] week gestation Rh positive History of COVID-19  PLAN:  Breastfeeding education, see chart.   Blood  consent today.  Water birth discussed and handouts given.  Practice packet given.  Anticipatory guidance regarding course of prenatal care.   Reviewed red flag symptoms and when to call.  RTC x 2 weeks for ROB with ANNIE and x 4 weeks for ROB with JML.  Juliann Pares, Student-MidWife [employer] 01/04/21 10:30 AM

## 2021-01-04 NOTE — Telephone Encounter (Signed)
Pt was checking out and handed me some paperwork, the pt stated that Marcelino Duster told her to give it to the front desk. The pt said that she would like them faxed to [phone]. I told the pt I will put them in Michelle's box.

## 2021-01-04 NOTE — Telephone Encounter (Signed)
Per Michelle and Crystal form fee needs to be collected.  

## 2021-01-08 DIAGNOSIS — Z419 Encounter for procedure for purposes other than remedying health state, unspecified: Secondary | ICD-10-CM | POA: Diagnosis not present

## 2021-01-08 NOTE — Telephone Encounter (Signed)
Error

## 2021-01-16 ENCOUNTER — Ambulatory Visit (INDEPENDENT_AMBULATORY_CARE_PROVIDER_SITE_OTHER): Payer: Medicaid Other | Admitting: Certified Nurse Midwife

## 2021-01-16 ENCOUNTER — Encounter: Payer: Self-pay | Admitting: Certified Nurse Midwife

## 2021-01-16 ENCOUNTER — Other Ambulatory Visit: Payer: Self-pay

## 2021-01-16 ENCOUNTER — Other Ambulatory Visit (HOSPITAL_COMMUNITY)
Admission: RE | Admit: 2021-01-16 | Discharge: 2021-01-16 | Disposition: A | Discharge status: Home / Self Care | Payer: Medicaid Other | Source: Ambulatory Visit | Attending: Certified Nurse Midwife | Admitting: Certified Nurse Midwife

## 2021-01-16 VITALS — BP 96/61 | HR 80 | Wt 192.1 lb

## 2021-01-16 DIAGNOSIS — N898 Other specified noninflammatory disorders of vagina: Secondary | ICD-10-CM | POA: Diagnosis not present

## 2021-01-16 DIAGNOSIS — O26893 Other specified pregnancy related conditions, third trimester: Secondary | ICD-10-CM | POA: Insufficient documentation

## 2021-01-16 DIAGNOSIS — Z3A34 34 weeks gestation of pregnancy: Secondary | ICD-10-CM

## 2021-01-16 LAB — POCT URINALYSIS DIPSTICK OB
Bilirubin, UA: NEGATIVE
Blood, UA: NEGATIVE
Glucose, UA: NEGATIVE
Ketones, UA: NEGATIVE
Leukocytes, UA: NEGATIVE
Nitrite, UA: NEGATIVE
POC,PROTEIN,UA: NEGATIVE
Spec Grav, UA: 1.015 (ref 1.010–1.025)
Urobilinogen, UA: 0.2 E.U./dL
pH, UA: 6 (ref 5.0–8.0)

## 2021-01-16 NOTE — Patient Instructions (Signed)

## 2021-01-16 NOTE — Progress Notes (Signed)
ROB doing well. Feels good movement. C/o vaginal discharge with itching. Swab collected, will follow up with results. Discussed water birth . Consent signed today. Pt encouraged to complete class as soon as possible.She verbalizes and agrees to plan. Follow up 2 wk with Marcelino Duster.   Doreene Burke, CNM

## 2021-01-17 LAB — CERVICOVAGINAL ANCILLARY ONLY
Bacterial Vaginitis (gardnerella): NEGATIVE
Candida Glabrata: NEGATIVE
Candida Vaginitis: POSITIVE — AB
Chlamydia: NEGATIVE
Comment: NEGATIVE
Comment: NEGATIVE
Comment: NEGATIVE
Comment: NEGATIVE
Comment: NEGATIVE
Comment: NORMAL
Neisseria Gonorrhea: NEGATIVE
Trichomonas: NEGATIVE

## 2021-01-19 ENCOUNTER — Other Ambulatory Visit: Payer: Self-pay | Admitting: Certified Nurse Midwife

## 2021-01-19 MED ORDER — MICONAZOLE NITRATE 2 % VA CREA: 1.0000 | TOPICAL_CREAM | Freq: Every day | VAGINAL | 0 refills | Status: AC

## 2021-01-31 ENCOUNTER — Other Ambulatory Visit: Payer: Self-pay

## 2021-01-31 ENCOUNTER — Ambulatory Visit (INDEPENDENT_AMBULATORY_CARE_PROVIDER_SITE_OTHER): Payer: Medicaid Other | Admitting: Certified Nurse Midwife

## 2021-01-31 VITALS — BP 112/68 | HR 87 | Wt 197.7 lb

## 2021-01-31 DIAGNOSIS — Z3483 Encounter for supervision of other normal pregnancy, third trimester: Secondary | ICD-10-CM

## 2021-01-31 DIAGNOSIS — Z3685 Encounter for antenatal screening for Streptococcus B: Secondary | ICD-10-CM

## 2021-01-31 DIAGNOSIS — Z3A36 36 weeks gestation of pregnancy: Secondary | ICD-10-CM

## 2021-01-31 LAB — POCT URINALYSIS DIPSTICK OB
Appearance: NORMAL
Bilirubin, UA: NEGATIVE
Blood, UA: NEGATIVE
Glucose, UA: NEGATIVE
Ketones, UA: NEGATIVE
Leukocytes, UA: NEGATIVE
Nitrite, UA: NEGATIVE
Odor: NORMAL
POC,PROTEIN,UA: NEGATIVE
Spec Grav, UA: 1.015 (ref 1.010–1.025)
Urobilinogen, UA: 0.2 E.U./dL
pH, UA: 7 (ref 5.0–8.0)

## 2021-01-31 NOTE — Progress Notes (Signed)
I have seen, interviewed, and examined the patient in conjunction with the [employer] and affirm the diagnosis and management plan.   Gunnar Bulla, CNM [facility], [facility] 01/31/21 9:53 AM

## 2021-01-31 NOTE — Progress Notes (Signed)
DJM:EQASTMHD for round ligament pain and back pain. Otherwise doing well.

## 2021-01-31 NOTE — Progress Notes (Signed)
ROB- Reports back and round ligament pain and resolving yeast infection; encouraged home treatment measures. GBS culture collected today, see orders. Pre-labor check list, herbal prep guide, and birth affirmations given. Anticipatory guidance regarding course of prenatal care. Reviewed red flag symptoms and when to call. RTC x 1 week for ROB with ANNIE and x 2 weeks for ROB with JML or sooner if needed.   Juliann Pares, Student-MidWife [employer] 01/31/21 9:38 AM

## 2021-01-31 NOTE — Patient Instructions (Addendum)
Back Pain in Pregnancy Back pain during pregnancy is common. Back pain may be caused by several factors that are related to changes during your pregnancy. Follow these instructions at home: Managing pain, stiffness, and swelling  If directed, for sudden (acute) back pain, put ice on the painful area. ? Put ice in a plastic bag. ? Place a towel between your skin and the bag. ? Leave the ice on for 20 minutes, 2-3 times per day.  If directed, apply heat to the affected area before you exercise. Use the heat source that your health care provider recommends, such as a moist heat pack or a heating pad. ? Place a towel between your skin and the heat source. ? Leave the heat on for 20-30 minutes. ? Remove the heat if your skin turns bright red. This is especially important if you are unable to feel pain, heat, or cold. You may have a greater risk of getting burned.  If directed, massage the affected area.      Activity  Exercise as told by your health care provider. Gentle exercise is the best way to prevent or manage back pain.  Listen to your body when lifting. If lifting hurts, ask for help or bend your knees. This uses your leg muscles instead of your back muscles.  Squat down when picking up something from the floor. Do not bend over.  Only use bed rest for short periods as told by your health care provider. Bed rest should only be used for the most severe episodes of back pain. Standing, sitting, and lying down  Do not stand in one place for long periods of time.  Use good posture when sitting. Make sure your head rests over your shoulders and is not hanging forward. Use a pillow on your lower back if necessary.  Try sleeping on your side, preferably the left side, with a pregnancy support pillow or 1-2 regular pillows between your legs. ? If you have back pain after a night's rest, your bed may be too soft. ? A firm mattress may provide more support for your back during  pregnancy. General instructions  Do not wear high heels.  Eat a healthy diet. Try to gain weight within your health care provider's recommendations.  Use a maternity girdle, elastic sling, or back brace as told by your health care provider.  Take over-the-counter and prescription medicines only as told by your health care provider.  Work with a physical therapist or massage therapist to find ways to manage back pain. Acupuncture or massage therapy may be helpful.  Keep all follow-up visits as told by your health care provider. This is important. Contact a health care provider if:  Your back pain interferes with your daily activities.  You have increasing pain in other parts of your body. Get help right away if:  You develop numbness, tingling, weakness, or problems with the use of your arms or legs.  You develop severe back pain that is not controlled with medicine.  You have a change in bowel or bladder control.  You develop shortness of breath, dizziness, or you faint.  You develop nausea, vomiting, or sweating.  You have back pain that is a rhythmic, cramping pain similar to labor pains. Labor pain is usually 1-2 minutes apart, lasts for about 1 minute, and involves a bearing down feeling or pressure in your pelvis.  You have back pain and your water breaks or you have vaginal bleeding.  You have back pain or   numbness that travels down your leg.  Your back pain developed after you fell.  You develop pain on one side of your back.  You see blood in your urine.  You develop skin blisters in the area of your back pain. Summary  Back pain may be caused by several factors that are related to changes during your pregnancy.  Follow instructions as told by your health care provider for managing pain, stiffness, and swelling.  Exercise as told by your health care provider. Gentle exercise is the best way to prevent or manage back pain.  Take over-the-counter and  prescription medicines only as told by your health care provider.  Keep all follow-up visits as told by your health care provider. This is important. This information is not intended to replace advice given to you by your health care provider. Make sure you discuss any questions you have with your health care provider. Document Revised: 03/15/2019 Document Reviewed: 05/12/2018 Elsevier Patient Education  2021 Elsevier Inc.     Vaginal Delivery  Vaginal delivery means that you give birth by pushing your baby out of your birth canal (vagina). A team of health care providers will help you before, during, and after vaginal delivery. Birth experiences are unique for every woman and every pregnancy, and birth experiences vary depending on where you choose to give birth. What happens when I arrive at the birth center or hospital? Once you are in labor and have been admitted into the hospital or birth center, your health care provider may:  Review your pregnancy history and any concerns that you have.  Insert an IV into one of your veins. This may be used to give you fluids and medicines.  Check your blood pressure, pulse, temperature, and heart rate (vital signs).  Check whether your bag of water (amniotic sac) has broken (ruptured).  Talk with you about your birth plan and discuss pain control options. Monitoring Your health care provider may monitor your contractions (uterine monitoring) and your baby's heart rate (fetal monitoring). You may need to be monitored:  Often, but not continuously (intermittently).  All the time or for long periods at a time (continuously). Continuous monitoring may be needed if: ? You are taking certain medicines, such as medicine to relieve pain or make your contractions stronger. ? You have pregnancy or labor complications. Monitoring may be done by:  Placing a special stethoscope or a handheld monitoring device on your abdomen to check your baby's  heartbeat and to check for contractions.  Placing monitors on your abdomen (external monitors) to record your baby's heartbeat and the frequency and length of contractions.  Placing monitors inside your uterus through your vagina (internal monitors) to record your baby's heartbeat and the frequency, length, and strength of your contractions. Depending on the type of monitor, it may remain in your uterus or on your baby's head until birth.  Telemetry. This is a type of continuous monitoring that can be done with external or internal monitors. Instead of having to stay in bed, you are able to move around during telemetry. Physical exam Your health care provider may perform frequent physical exams. This may include:  Checking how and where your baby is positioned in your uterus.  Checking your cervix to determine: ? Whether it is thinning out (effacing). ? Whether it is opening up (dilating). What happens during labor and delivery? Normal labor and delivery is divided into the following three stages: Stage 1  This is the longest stage of labor.    This stage can last for hours or days.  Throughout this stage, you will feel contractions. Contractions generally feel mild, infrequent, and irregular at first. They get stronger, more frequent (about every 2-3 minutes), and more regular as you move through this stage.  This stage ends when your cervix is completely dilated to 4 inches (10 cm) and completely effaced. Stage 2  This stage starts once your cervix is completely effaced and dilated and lasts until the delivery of your baby.  This stage may last from 20 minutes to 2 hours.  This is the stage where you will feel an urge to push your baby out of your vagina.  You may feel stretching and burning pain, especially when the widest part of your baby's head passes through the vaginal opening (crowning).  Once your baby is delivered, the umbilical cord will be clamped and cut. This usually  occurs after waiting a period of 1-2 minutes after delivery.  Your baby will be placed on your bare chest (skin-to-skin contact) in an upright position and covered with a warm blanket. Watch your baby for feeding cues, like rooting or sucking, and help the baby to your breast for his or her first feeding. Stage 3  This stage starts immediately after the birth of your baby and ends after you deliver the placenta.  This stage may take anywhere from 5 to 30 minutes.  After your baby has been delivered, you will feel contractions as your body expels the placenta and your uterus contracts to control bleeding.   What can I expect after labor and delivery?  After labor is over, you and your baby will be monitored closely until you are ready to go home to ensure that you are both healthy. Your health care team will teach you how to care for yourself and your baby.  You and your baby will stay in the same room (rooming in) during your hospital stay. This will encourage early bonding and successful breastfeeding.  You may continue to receive fluids and medicines through an IV.  Your uterus will be checked and massaged regularly (fundal massage).  You will have some soreness and pain in your abdomen, vagina, and the area of skin between your vaginal opening and your anus (perineum).  If an incision was made near your vagina (episiotomy) or if you had some vaginal tearing during delivery, cold compresses may be placed on your episiotomy or your tear. This helps to reduce pain and swelling.  You may be given a squirt bottle to use instead of wiping when you go to the bathroom. To use the squirt bottle, follow these steps: ? Before you urinate, fill the squirt bottle with warm water. Do not use hot water. ? After you urinate, while you are sitting on the toilet, use the squirt bottle to rinse the area around your urethra and vaginal opening. This rinses away any urine and blood. ? Fill the squirt bottle  with clean water every time you use the bathroom.  It is normal to have vaginal bleeding after delivery. Wear a sanitary pad for vaginal bleeding and discharge. Summary  Vaginal delivery means that you will give birth by pushing your baby out of your birth canal (vagina).  Your health care provider may monitor your contractions (uterine monitoring) and your baby's heart rate (fetal monitoring).  Your health care provider may perform a physical exam.  Normal labor and delivery is divided into three stages.  After labor is over, you and your  baby will be monitored closely until you are ready to go home. This information is not intended to replace advice given to you by your health care provider. Make sure you discuss any questions you have with your health care provider. Document Revised: 12/29/2017 Document Reviewed: 12/29/2017 Elsevier Patient Education  2021 [employer].    First Stage of Labor Labor is your body's natural process of moving your baby and other structures, including the placenta and umbilical cord, out of your uterus. There are three stages of labor. How long each stage lasts is different for every woman. But certain events happen during each stage that are the same for everyone.  The first stage starts when true labor begins. This stage ends when your cervix, which is the opening from your uterus into your vagina, is completely open (dilated).  The second stage begins when your cervix is fully dilated and you start pushing. This stage ends when your baby is born.  The third stage is the delivery of the organ that nourished your baby during pregnancy (placenta). First stage of labor As your due date gets closer, you may start to notice certain physical changes that mean labor is going to start soon. You may feel that your baby has dropped lower into your pelvis. You may experience irregular, often painless, contractions that go away when you walk around or lie down  ([employer] contractions). This is also called false labor. The first stage of labor begins when you start having contractions that come at regular (evenly spaced) intervals and your cervix starts to get thinner and wider in preparation for your baby to pass through. Birth care providers measure the dilation of your cervix in centimeters (cm). One centimeter is a little less than one-half of an inch. The first stage ends when your cervix is dilated to 10 cm. The first stage of labor is divided into three phases:  Early phase.  Active phase.  Transitional phase. The length of the first stage of labor varies. It may be longer if this is your first pregnancy. You may spend most of this stage at home trying to relax and stay comfortable. How does this affect me? During the first stage of labor, you will move through three phases. What happens in the early phase?  You will start to have regular contractions that last 30-60 seconds. Contractions may come every 5-20 minutes. Keep track of your contractions and call your birth care provider.  Your water may break during this phase.  You may notice a clear or slightly bloody discharge of mucus (mucus plug) from your vagina.  Your cervix will dilate to 3-6 cm. What happens in the active phase? The active phase usually lasts 3-5 hours. You may go to the hospital or birth center around this time. During the active phase:  Your contractions will become stronger, longer, and more uncomfortable.  Your contractions may last 45-90 seconds and come every 3-5 minutes.  You may feel lower back pain.  Your birth care providers may examine your cervix and feel your belly to find the position of your baby.  You may have a monitor strapped to your belly to measure your contractions and your baby's heart rate.  You may start using your pain management options.  Your cervix may be dilated to 6 cm and may start to dilate more quickly. What happens in the  transitional phase? The transitional phase typically lasts from 30 minutes to 2 hours. At the end of this  phase, your cervix will be fully dilated to 10 cm. During the transitional phase:  Contractions will get stronger and longer.  Contractions may last 60-90 seconds and come less than 2 minutes apart.  You may feel hot flashes, chills, or nausea. How does this affect my baby? During the first stage of labor, your baby will gradually move down into your birth canal. Follow these instructions at home and in the hospital or birth center:  When labor first begins, try to stay calm. You are still in the early phase. If it is night, try to get some sleep. If it is day, try to relax and save your energy. You may want to make some calls and get ready to go to the hospital or birth center.  When you are in the early phase, try these methods to help ease discomfort: ? Deep breathing and muscle relaxation. ? Taking a walk. ? Taking a warm bath or shower.  Drink some fluids and have a light snack if you feel like it.  Keep track of your contractions.  Based on the plan you created with your birth care provider, call when your contractions indicate it is time.  If your water breaks, note the time, color, and odor of the fluid.  When you are in the active phase, do your breathing exercises and rely on your support people and your team of birth care providers.   Contact a health care provider if:  Your contractions are strong and regular.  You have lower back pain or cramping.  Your water breaks.  You lose your mucus plug. Get help right away if you:  Have a severe headache that does not go away.  Have changes in your vision.  Have severe pain in your upper belly.  Do not feel the baby move.  Have bright red bleeding. Summary  The first stage of labor starts when true labor begins, and it ends when your cervix is dilated to 10 cm.  The first stage of labor has three phases:  early, active, and transitional.  Your baby moves into the birth canal during the first stage of labor.  You may have contractions that become stronger and longer. You may also lose your mucus plug and have your water break.  Call your birth care provider when your contractions are frequent and strong enough to go to the hospital or birth center. This information is not intended to replace advice given to you by your health care provider. Make sure you discuss any questions you have with your health care provider. Document Revised: 03/17/2019 Document Reviewed: 02/07/2018 Elsevier Patient Education  2021 [employer].    Third Trimester of Pregnancy  The third trimester of pregnancy is from week 28 through week 40. This is also called months 7 through 9. This trimester is when your unborn baby (fetus) is growing very fast. At the end of the ninth month, the unborn baby is about 20 inches long. It weighs about 6-10 pounds. Body changes during your third trimester Your body continues to go through many changes during this time. The changes vary and generally return to normal after the baby is born. Physical changes  Your weight will continue to increase. You may gain 25-35 pounds (11-16 kg) by the end of the pregnancy. If you are underweight, you may gain 28-40 lb (about 13-18 kg). If you are overweight, you may gain 15-25 lb (about 7-11 kg).  You may start to get stretch marks on your  hips, belly (abdomen), and breasts.  Your breasts will continue to grow and may hurt. A yellow fluid (colostrum) may leak from your breasts. This is the first milk you are making for your baby.  You may have changes in your hair.  Your belly button may stick out.  You may have more swelling in your hands, face, or ankles. Health changes  You may have heartburn.  You may have trouble pooping (constipation).  You may get hemorrhoids. These are swollen veins in the butt that can itch or get  painful.  You may have swollen veins (varicose veins) in your legs.  You may have more body aches in the pelvis, back, or thighs.  You may have more tingling or numbness in your hands, arms, and legs. The skin on your belly may also feel numb.  You may feel short of breath as your womb (uterus) gets bigger. Other changes  You may pee (urinate) more often.  You may have more problems sleeping.  You may notice the unborn baby "dropping," or moving lower in your belly.  You may have more discharge coming from your vagina.  Your joints may feel loose, and you may have pain around your pelvic bone. Follow these instructions at home: Medicines  Take over-the-counter and prescription medicines only as told by your doctor. Some medicines are not safe during pregnancy.  Take a prenatal vitamin that contains at least 600 micrograms (mcg) of folic acid. Eating and drinking  Eat healthy meals that include: ? Fresh fruits and vegetables. ? Whole grains. ? Good sources of protein, such as meat, eggs, or tofu. ? Low-fat dairy products.  Avoid raw meat and unpasteurized juice, milk, and cheese. These carry germs that can harm you and your baby.  Eat 4 or 5 small meals rather than 3 large meals a day.  You may need to take these actions to prevent or treat trouble pooping: ? Drink enough fluids to keep your pee (urine) pale yellow. ? Eat foods that are high in fiber. These include beans, whole grains, and fresh fruits and vegetables. ? Limit foods that are high in fat and sugar. These include fried or sweet foods. Activity  Exercise only as told by your doctor. Stop exercising if you start to have cramps in your womb.  Avoid heavy lifting.  Do not exercise if it is too hot or too humid, or if you are in a place of great height (high altitude).  If you choose to, you may have sex unless your doctor tells you not to. Relieving pain and discomfort  Take breaks often, and rest with  your legs raised (elevated) if you have leg cramps or low back pain.  Take warm water baths (sitz baths) to soothe pain or discomfort caused by hemorrhoids. Use hemorrhoid cream if your doctor approves.  Wear a good support bra if your breasts are tender.  If you develop bulging, swollen veins in your legs: ? Wear support hose as told by your doctor. ? Raise your feet for 15 minutes, 3-4 times a day. ? Limit salt in your food. Safety  Talk to your doctor before traveling far distances.  Do not use hot tubs, steam rooms, or saunas.  Wear your seat belt at all times when you are in a car.  Talk with your doctor if someone is hurting you or yelling at you a lot. Preparing for your baby's arrival To prepare for the arrival of your baby:  Take prenatal classes.  Visit the hospital and tour the maternity area.  Buy a rear-facing car seat. Learn how to install it in your car.  Prepare the baby's room. Take out all pillows and stuffed animals from the baby's crib. General instructions  Avoid cat litter boxes and soil used by cats. These carry germs that can cause harm to the baby and can cause a loss of your baby by miscarriage or stillbirth.  Do not douche or use tampons. Do not use scented sanitary pads.  Do not smoke or use any products that contain nicotine or tobacco. If you need help quitting, ask your doctor.  Do not drink alcohol.  Do not use herbal medicines, illegal drugs, or medicines that were not approved by your doctor. Chemicals in these products can affect your baby.  Keep all follow-up visits. This is important. Where to find more information  American Pregnancy Association: americanpregnancy.org  [employer]: www.acog.org  Office on Women's Health: [employer].gov/pregnancy Contact a doctor if:  You have a fever.  You have mild cramps or pressure in your lower belly.  You have a nagging pain in your belly  area.  You vomit, or you have watery poop (diarrhea).  You have bad-smelling fluid coming from your vagina.  You have pain when you pee, or your pee smells bad.  You have a headache that does not go away when you take medicine.  You have changes in how you see, or you see spots in front of your eyes. Get help right away if:  Your water breaks.  You have regular contractions that are less than 5 minutes apart.  You are spotting or bleeding from your vagina.  You have very bad belly cramps or pain.  You have trouble breathing.  You have chest pain.  You faint.  You have not felt the baby move for the amount of time told by your doctor.  You have new or increased pain, swelling, or redness in an arm or leg. Summary  The third trimester is from week 28 through week 40 (months 7 through 9). This is the time when your unborn baby is growing very fast.  During this time, your discomfort may increase as you gain weight and as your baby grows.  Get ready for your baby to arrive by taking prenatal classes, buying a rear-facing car seat, and preparing the baby's room.  Get help right away if you are bleeding from your vagina, you have chest pain and trouble breathing, or you have not felt the baby move for the amount of time told by your doctor. This information is not intended to replace advice given to you by your health care provider. Make sure you discuss any questions you have with your health care provider. Document Revised: 05/02/2020 Document Reviewed: 03/08/2020 Elsevier Patient Education  2021 [employer]

## 2021-02-04 ENCOUNTER — Encounter: Payer: Self-pay | Admitting: Certified Nurse Midwife

## 2021-02-04 DIAGNOSIS — B951 Streptococcus, group B, as the cause of diseases classified elsewhere: Secondary | ICD-10-CM | POA: Insufficient documentation

## 2021-02-04 LAB — CULTURE, BETA STREP (GROUP B ONLY): Strep Gp B Culture: POSITIVE — AB

## 2021-02-05 DIAGNOSIS — Z419 Encounter for procedure for purposes other than remedying health state, unspecified: Secondary | ICD-10-CM | POA: Diagnosis not present

## 2021-02-06 ENCOUNTER — Ambulatory Visit (INDEPENDENT_AMBULATORY_CARE_PROVIDER_SITE_OTHER): Payer: Medicaid Other | Admitting: Certified Nurse Midwife

## 2021-02-06 ENCOUNTER — Other Ambulatory Visit: Payer: Self-pay

## 2021-02-06 ENCOUNTER — Encounter: Payer: Self-pay | Admitting: Certified Nurse Midwife

## 2021-02-06 VITALS — BP 113/78 | HR 92 | Wt 200.5 lb

## 2021-02-06 DIAGNOSIS — Z3483 Encounter for supervision of other normal pregnancy, third trimester: Secondary | ICD-10-CM

## 2021-02-06 DIAGNOSIS — Z3A37 37 weeks gestation of pregnancy: Secondary | ICD-10-CM

## 2021-02-06 LAB — POCT URINALYSIS DIPSTICK OB
Bilirubin, UA: NEGATIVE
Blood, UA: NEGATIVE
Glucose, UA: NEGATIVE
Ketones, UA: NEGATIVE
Nitrite, UA: NEGATIVE
Odor: NEGATIVE
POC,PROTEIN,UA: NEGATIVE
Spec Grav, UA: 1.025 (ref 1.010–1.025)
Urobilinogen, UA: 0.2 E.U./dL
pH, UA: 6 (ref 5.0–8.0)

## 2021-02-06 NOTE — Progress Notes (Signed)
ROB doing well. Feels good movement. Labor precautions reviewed. Will bring water birth certificate next visit. She denies any concerns or questions. Follow up 1 wk with Marcelino Duster for ROB.   Doreene Burke, CNM

## 2021-02-06 NOTE — Patient Instructions (Signed)

## 2021-02-06 NOTE — Progress Notes (Signed)
ROB- no complaints.  

## 2021-02-08 LAB — URINE CULTURE

## 2021-02-13 ENCOUNTER — Telehealth: Payer: Self-pay | Admitting: Certified Nurse Midwife

## 2021-02-13 NOTE — Telephone Encounter (Signed)
Left message for pt making her aware that her FMLA paperwork is still at front desk- we are waiting on her fee to be paid before completion. [place] had attempted to contact pt when first received.

## 2021-02-14 ENCOUNTER — Ambulatory Visit (INDEPENDENT_AMBULATORY_CARE_PROVIDER_SITE_OTHER): Payer: Medicaid Other | Admitting: Certified Nurse Midwife

## 2021-02-14 ENCOUNTER — Other Ambulatory Visit: Payer: Self-pay

## 2021-02-14 VITALS — BP 128/80 | HR 83 | Wt 206.1 lb

## 2021-02-14 DIAGNOSIS — Z3403 Encounter for supervision of normal first pregnancy, third trimester: Secondary | ICD-10-CM

## 2021-02-14 DIAGNOSIS — B951 Streptococcus, group B, as the cause of diseases classified elsewhere: Secondary | ICD-10-CM

## 2021-02-14 DIAGNOSIS — Z3A38 38 weeks gestation of pregnancy: Secondary | ICD-10-CM

## 2021-02-14 LAB — POCT URINALYSIS DIPSTICK OB
Appearance: ABNORMAL
Bilirubin, UA: NEGATIVE
Blood, UA: NEGATIVE
Glucose, UA: NEGATIVE
Ketones, UA: NEGATIVE
Nitrite, UA: NEGATIVE
Odor: NORMAL
POC,PROTEIN,UA: NEGATIVE
Spec Grav, UA: 1.025 (ref 1.010–1.025)
Urobilinogen, UA: 0.2 E.U./dL
pH, UA: 7 (ref 5.0–8.0)

## 2021-02-14 NOTE — Addendum Note (Signed)
Addended by: Marchelle Folks on: 02/14/2021 10:46 AM   Modules accepted: Orders

## 2021-02-14 NOTE — Progress Notes (Signed)
ROB doing well. She has no concerns today.

## 2021-02-14 NOTE — Patient Instructions (Addendum)
Vaginal Delivery  Vaginal delivery means that you give birth by pushing your baby out of your birth canal (vagina). A team of health care providers will help you before, during, and after vaginal delivery. Birth experiences are unique for every woman and every pregnancy, and birth experiences vary depending on where you choose to give birth. What happens when I arrive at the birth center or hospital? Once you are in labor and have been admitted into the hospital or birth center, your health care provider may:  Review your pregnancy history and any concerns that you have.  Insert an IV into one of your veins. This may be used to give you fluids and medicines.  Check your blood pressure, pulse, temperature, and heart rate (vital signs).  Check whether your bag of water (amniotic sac) has broken (ruptured).  Talk with you about your birth plan and discuss pain control options. Monitoring Your health care provider may monitor your contractions (uterine monitoring) and your baby's heart rate (fetal monitoring). You may need to be monitored:  Often, but not continuously (intermittently).  All the time or for long periods at a time (continuously). Continuous monitoring may be needed if: ? You are taking certain medicines, such as medicine to relieve pain or make your contractions stronger. ? You have pregnancy or labor complications. Monitoring may be done by:  Placing a special stethoscope or a handheld monitoring device on your abdomen to check your baby's heartbeat and to check for contractions.  Placing monitors on your abdomen (external monitors) to record your baby's heartbeat and the frequency and length of contractions.  Placing monitors inside your uterus through your vagina (internal monitors) to record your baby's heartbeat and the frequency, length, and strength of your contractions. Depending on the type of monitor, it may remain in your uterus or on your baby's head until  birth.  Telemetry. This is a type of continuous monitoring that can be done with external or internal monitors. Instead of having to stay in bed, you are able to move around during telemetry. Physical exam Your health care provider may perform frequent physical exams. This may include:  Checking how and where your baby is positioned in your uterus.  Checking your cervix to determine: ? Whether it is thinning out (effacing). ? Whether it is opening up (dilating). What happens during labor and delivery? Normal labor and delivery is divided into the following three stages: Stage 1  This is the longest stage of labor.  This stage can last for hours or days.  Throughout this stage, you will feel contractions. Contractions generally feel mild, infrequent, and irregular at first. They get stronger, more frequent (about every 2-3 minutes), and more regular as you move through this stage.  This stage ends when your cervix is completely dilated to 4 inches (10 cm) and completely effaced. Stage 2  This stage starts once your cervix is completely effaced and dilated and lasts until the delivery of your baby.  This stage may last from 20 minutes to 2 hours.  This is the stage where you will feel an urge to push your baby out of your vagina.  You may feel stretching and burning pain, especially when the widest part of your baby's head passes through the vaginal opening (crowning).  Once your baby is delivered, the umbilical cord will be clamped and cut. This usually occurs after waiting a period of 1-2 minutes after delivery.  Your baby will be placed on your bare chest (skin-to-skin   contact) in an upright position and covered with a warm blanket. Watch your baby for feeding cues, like rooting or sucking, and help the baby to your breast for his or her first feeding. Stage 3  This stage starts immediately after the birth of your baby and ends after you deliver the placenta.  This stage may  take anywhere from 5 to 30 minutes.  After your baby has been delivered, you will feel contractions as your body expels the placenta and your uterus contracts to control bleeding.   What can I expect after labor and delivery?  After labor is over, you and your baby will be monitored closely until you are ready to go home to ensure that you are both healthy. Your health care team will teach you how to care for yourself and your baby.  You and your baby will stay in the same room (rooming in) during your hospital stay. This will encourage early bonding and successful breastfeeding.  You may continue to receive fluids and medicines through an IV.  Your uterus will be checked and massaged regularly (fundal massage).  You will have some soreness and pain in your abdomen, vagina, and the area of skin between your vaginal opening and your anus (perineum).  If an incision was made near your vagina (episiotomy) or if you had some vaginal tearing during delivery, cold compresses may be placed on your episiotomy or your tear. This helps to reduce pain and swelling.  You may be given a squirt bottle to use instead of wiping when you go to the bathroom. To use the squirt bottle, follow these steps: ? Before you urinate, fill the squirt bottle with warm water. Do not use hot water. ? After you urinate, while you are sitting on the toilet, use the squirt bottle to rinse the area around your urethra and vaginal opening. This rinses away any urine and blood. ? Fill the squirt bottle with clean water every time you use the bathroom.  It is normal to have vaginal bleeding after delivery. Wear a sanitary pad for vaginal bleeding and discharge. Summary  Vaginal delivery means that you will give birth by pushing your baby out of your birth canal (vagina).  Your health care provider may monitor your contractions (uterine monitoring) and your baby's heart rate (fetal monitoring).  Your health care provider may  perform a physical exam.  Normal labor and delivery is divided into three stages.  After labor is over, you and your baby will be monitored closely until you are ready to go home. This information is not intended to replace advice given to you by your health care provider. Make sure you discuss any questions you have with your health care provider. Document Revised: 12/29/2017 Document Reviewed: 12/29/2017 Elsevier Patient Education  2021 [employer] Inc[employer]   Fetal Movement Counts Patient Name: ________________________________________________ Patient Due Date: ____________________  What is a fetal movement count? A fetal movement count is the number of times that you feel your baby move during a certain amount of time. This may also be called a fetal kick count. A fetal movement count is recommended for every pregnant woman. You may be asked to start counting fetal movements as early as week 28 of your pregnancy. Pay attention to when your baby is most active. You may notice your baby's sleep and wake cycles. You may also notice things that make your baby move more. You should do a fetal movement count:  When your baby is normally most  active.  At the same time each day. A good time to count movements is while you are resting, after having something to eat and drink. How do I count fetal movements? 1. Find a quiet, comfortable area. Sit, or lie down on your side. 2. Write down the date, the start time and stop time, and the number of movements that you felt between those two times. Take this information with you to your health care visits. 3. Write down your start time when you feel the first movement. 4. Count kicks, flutters, swishes, rolls, and jabs. You should feel at least 10 movements. 5. You may stop counting after you have felt 10 movements, or if you have been counting for 2 hours. Write down the stop time. 6. If you do not feel 10 movements in 2 hours, contact your health care  provider for further instructions. Your health care provider may want to do additional tests to assess your baby's well-being. Contact a health care provider if:  You feel fewer than 10 movements in 2 hours.  Your baby is not moving like he or she usually does. Date: ____________ Start time: ____________ Stop time: ____________ Movements: ____________ Date: ____________ Start time: ____________ Stop time: ____________ Movements: ____________ Date: ____________ Start time: ____________ Stop time: ____________ Movements: ____________ Date: ____________ Start time: ____________ Stop time: ____________ Movements: ____________ Date: ____________ Start time: ____________ Stop time: ____________ Movements: ____________ Date: ____________ Start time: ____________ Stop time: ____________ Movements: ____________ Date: ____________ Start time: ____________ Stop time: ____________ Movements: ____________ Date: ____________ Start time: ____________ Stop time: ____________ Movements: ____________ Date: ____________ Start time: ____________ Stop time: ____________ Movements: ____________ This information is not intended to replace advice given to you by your health care provider. Make sure you discuss any questions you have with your health care provider. Document Revised: 07/14/2019 Document Reviewed: 07/14/2019 Elsevier Patient Education  2021 [employer] Inc.  Group B Streptococcus Infection During Pregnancy Group B Streptococcus (GBS) is a type of bacteria that is often found in healthy people. It is commonly found in the rectum, vagina, and intestines. In people who are healthy and not pregnant, the bacteria rarely cause serious illness or complications. However, women who test positive for GBS during pregnancy can pass the bacteria to the baby during childbirth. This can cause serious infection in the baby after birth. Women with GBS may also have infections during their pregnancy or soon after childbirth.  The infections include urinary tract infections (UTIs) or infections of the uterus. GBS also increases a woman's risk of complications during pregnancy, such as early labor or delivery, miscarriage, or stillbirth. Routine testing for GBS is recommended for all pregnant women. What are the causes? This condition is caused by bacteria called Streptococcus agalactiae. What increases the risk? You may have a higher risk for GBS infection during pregnancy if you had one during a past pregnancy. What are the signs or symptoms? In most cases, GBS infection does not cause symptoms in pregnant women. If symptoms exist, they may include:  Labor that starts before the 37th week of pregnancy.  A UTI or bladder infection. This may cause a fever, frequent urination, or pain and burning during urination.  Fever during labor. There can also be a rapid heartbeat in the mother or baby. Rare but serious symptoms of a GBS infection in women include:  Blood infection (septicemia). This may cause fever, chills, or confusion.  Lung infection (pneumonia). This may cause fever, chills, cough, rapid breathing, chest pain, or  difficulty breathing.  Bone, joint, skin, or soft tissue infection. How is this diagnosed? You may be screened for GBS between week 35 and week 37 of pregnancy. If you have symptoms of preterm labor, you may be screened earlier. This condition is diagnosed based on lab test results from:  A swab of fluid from the vagina and rectum.  A urine sample. How is this treated? This condition is treated with antibiotic medicine. Antibiotic medicine may be given:  To you when you go into labor, or as soon as your water breaks. The medicines will continue until after you give birth. If you are having a cesarean delivery, you do not need antibiotics unless your water has broken.  To your baby, if he or she requires treatment. Your health care provider will check your baby to decide if he or she needs  antibiotics to prevent a serious infection.   Follow these instructions at home:  Take over-the-counter and prescription medicines only as told by your health care provider.  Take your antibiotic medicine as told by your health care provider. Do not stop taking the antibiotic even if you start to feel better.  Keep all pre-birth (prenatal) visits and follow-up visits as told by your health care provider. This is important. Contact a health care provider if:  You have pain or burning when you urinate.  You have to urinate more often than usual.  You have a fever or chills.  You develop a bad-smelling vaginal discharge. Get help right away if:  Your water breaks.  You go into labor.  You have severe pain in your abdomen.  You have difficulty breathing.  You have chest pain. These symptoms may represent a serious problem that is an emergency. Do not wait to see if the symptoms will go away. Get medical help right away. Call your local emergency services (911 in the [place] Do not drive yourself to the hospital. Summary  GBS is a type of bacteria that is common in healthy people.  During pregnancy, colonization with GBS can cause serious complications for you or your baby.  Your health care provider will screen you between 35 and 37 weeks of pregnancy to determine if you are colonized with GBS.  If you are colonized with GBS during pregnancy, your health care provider will recommend antibiotics through an IV during labor.  After delivery, your baby will be evaluated for complications related to potential GBS infection and may require antibiotics to prevent a serious infection. This information is not intended to replace advice given to you by your health care provider. Make sure you discuss any questions you have with your health care provider. Document Revised: 09/25/2020 Document Reviewed: 06/20/2019 Elsevier Patient Education  2021 [employer].

## 2021-02-14 NOTE — Progress Notes (Signed)
ROB-Reports insomnia. Requests SVE. Discussed home treatment measures for "miseries of pregnancy" and labor preparation techniques. Anticipatory guidance regarding course of prenatal care. Agrees to IV antibiotics for treatment of GBS during labor. Reviewed red flag symptoms and when to call. RTC x 1 week for ROB and RTC x 2 weeks for ROB and NST or sooner if needed.

## 2021-02-15 ENCOUNTER — Telehealth: Payer: Self-pay | Admitting: Certified Nurse Midwife

## 2021-02-15 ENCOUNTER — Encounter: Payer: Self-pay | Admitting: Certified Nurse Midwife

## 2021-02-15 NOTE — Telephone Encounter (Signed)
Pt has not contacted [place] about FMLA paperwork- I attempted to leave another message was unable to LM, I have placed a mini message next to her next apt on 3-16 and sent a communication to pt via mychart.

## 2021-02-16 LAB — URINE CULTURE

## 2021-02-20 ENCOUNTER — Encounter: Payer: Self-pay | Admitting: Certified Nurse Midwife

## 2021-02-20 ENCOUNTER — Other Ambulatory Visit: Payer: Self-pay

## 2021-02-20 ENCOUNTER — Telehealth: Payer: Self-pay | Admitting: Certified Nurse Midwife

## 2021-02-20 ENCOUNTER — Ambulatory Visit (INDEPENDENT_AMBULATORY_CARE_PROVIDER_SITE_OTHER): Payer: Medicaid Other | Admitting: Certified Nurse Midwife

## 2021-02-20 VITALS — BP 125/77 | HR 93 | Wt 209.0 lb

## 2021-02-20 DIAGNOSIS — Z3A39 39 weeks gestation of pregnancy: Secondary | ICD-10-CM

## 2021-02-20 DIAGNOSIS — Z3403 Encounter for supervision of normal first pregnancy, third trimester: Secondary | ICD-10-CM

## 2021-02-20 LAB — POCT URINALYSIS DIPSTICK OB
Bilirubin, UA: NEGATIVE
Blood, UA: NEGATIVE
Glucose, UA: NEGATIVE
Ketones, UA: NEGATIVE
Nitrite, UA: NEGATIVE
POC,PROTEIN,UA: NEGATIVE
Spec Grav, UA: 1.02 (ref 1.010–1.025)
Urobilinogen, UA: 0.2 E.U./dL
pH, UA: 6 (ref 5.0–8.0)

## 2021-02-20 NOTE — Telephone Encounter (Signed)
So the dosing is on the herbal prep handout we given them in the office.   The evening primrose oil is 1000 mg BID. By mouth in the AM, Vaginally @ bedtime ( if c/s both doses by mouth)  Blue Cohosh 1 capsule daily  39 wks 2 capsule daily 40 wks 3 capsule daily 41 wks

## 2021-02-20 NOTE — Progress Notes (Signed)
ROB doing well. Feels good movement. Discussed labor precautions. Using Herbal prep hand out. Completion certificate of water birth class received. Copy to chart . Class was completed 01/28/21. SVE per pt request. No cervical change noted. Pt state she has apt and NST schedule 1 wk with Marcelino Duster. Follow up 1 wk or prn .  Doreene Burke, CNM

## 2021-02-20 NOTE — Patient Instructions (Signed)

## 2021-02-20 NOTE — Progress Notes (Signed)
ROB- Back pain.

## 2021-02-20 NOTE — Telephone Encounter (Signed)
No answer

## 2021-02-20 NOTE — Telephone Encounter (Signed)
[facility] called and had some questions about the Herbal prep handout.  Angelica Mason states she was unsure of how much Primrose Oil and Black Cohosh she was supposed to take.  Angelica Mason is just asking for some clarification.  Please advise.  [phone] confirmed good call back number.

## 2021-02-21 ENCOUNTER — Encounter: Payer: Self-pay | Admitting: Obstetrics and Gynecology

## 2021-02-21 ENCOUNTER — Observation Stay
Admission: EM | Admit: 2021-02-21 | Discharge: 2021-02-21 | Disposition: A | Discharge status: Home / Self Care | Payer: Medicaid Other | Attending: Certified Nurse Midwife | Admitting: Certified Nurse Midwife

## 2021-02-21 ENCOUNTER — Other Ambulatory Visit: Payer: Self-pay

## 2021-02-21 DIAGNOSIS — O471 False labor at or after 37 completed weeks of gestation: Secondary | ICD-10-CM | POA: Diagnosis not present

## 2021-02-21 DIAGNOSIS — Z8616 Personal history of COVID-19: Secondary | ICD-10-CM | POA: Insufficient documentation

## 2021-02-21 DIAGNOSIS — Z3A39 39 weeks gestation of pregnancy: Secondary | ICD-10-CM

## 2021-02-21 NOTE — Discharge Instructions (Signed)
Call provider or return to birthplace with:  1. Strong regular contractions every 5 minutes. 2. Leaking of fluid from your vaginal 3. Vaginal bleeding: Bright red or heavy like a period 4. Decreased Fetal movement

## 2021-02-21 NOTE — OB Triage Note (Signed)
Pt presents to L&D with c/o contractions since 11am q 5 min, Pt reports brownish discharge since contractions started. Pt seen at office yesterday and sve performed, pt reports being 1cm. Pt denies Leaking of fluid and reports good fetal movement. EFM and toco applied and explained. Plan to monitor fetal and maternal well being and assess for labor.

## 2021-02-21 NOTE — Telephone Encounter (Signed)
No answer. Will send mychart message.

## 2021-02-22 NOTE — Discharge Summary (Signed)
Obstetric Discharge Summary  Patient ID: Angelica Mason MRN: [id number] DOB/AGE: 1996-07-20 25 y.o.   Date of Admission: 02/21/2021  Date of Discharge: 02/21/2021  Admitting Diagnosis: Observation at [redacted]w[redacted]d  Secondary Diagnosis:   Patient Active Problem List   Diagnosis Date Noted  . Indication for care in labor and delivery, antepartum 02/21/2021  . Group beta Strep positive 02/04/2021  . History of COVID-19 01/04/2021  . Supervision of other normal pregnancy, antepartum 01/04/2021  . Type A blood, Rh positive 01/04/2021  . RLQ abdominal pain 12/22/2020       Discharge Diagnosis: No other diagnosis   Antepartum Procedures: NST    Brief Hospital Course   L&D OB Triage Note  Angelica Mason is a 25 y.o. G66P0020 female at [redacted]w[redacted]d, EDD Estimated Date of Delivery: 02/26/21 who presented to triage for complaints of contractions.  She was evaluated by the nurses with no significant findings for maternal/fetal distress or labor. Vital signs stable. An NST was performed and has been reviewed by CNM. She was given the [employer] to complete.  NST INTERPRETATION:  Indications: rule out uterine contractions  Mode: External Baseline Rate (A): 130 bpm Variability: Moderate Accelerations: 15 x 15 Decelerations: Variable Contraction Frequency (min): 6-9  Impression: reactive   Plan: NST performed was reviewed and was found to be reactive. She was discharged home with bleeding/labor precautions.  Continue routine prenatal care. Follow up with CNM as previously scheduled.    Discharge Instructions: Per After Visit Summary.  Activity:  Also refer to After Visit Summary.  Diet: Regular  Medications: Allergies as of 02/21/2021      Reactions   Toradol [ketorolac Tromethamine] Itching      Medication List    TAKE these medications   acetaminophen 325 MG tablet Commonly known as: TYLENOL Take 650 mg by mouth every 6 (six) hours as needed.   metoCLOPramide 10 MG  tablet Commonly known as: REGLAN Take 10 mg by mouth 4 (four) times daily.   prenatal multivitamin Tabs tablet Take 1 tablet by mouth daily at 12 noon.   promethazine 12.5 MG tablet Commonly known as: PHENERGAN Take 12.5 mg by mouth every 6 (six) hours as needed for nausea or vomiting.      Outpatient follow up: As previously scheduled  Discharged Condition: stable  Discharged to: home   Serafina Royals, CNM  [facility], [facility] 02/22/21 10:35 AM

## 2021-02-23 ENCOUNTER — Other Ambulatory Visit: Payer: Self-pay

## 2021-02-23 ENCOUNTER — Encounter: Payer: Self-pay | Admitting: Obstetrics and Gynecology

## 2021-02-23 ENCOUNTER — Observation Stay
Admission: EM | Admit: 2021-02-23 | Discharge: 2021-02-24 | Disposition: A | Discharge status: Home / Self Care | Payer: Medicaid Other | Attending: Certified Nurse Midwife | Admitting: Certified Nurse Midwife

## 2021-02-23 DIAGNOSIS — O471 False labor at or after 37 completed weeks of gestation: Secondary | ICD-10-CM | POA: Diagnosis not present

## 2021-02-23 DIAGNOSIS — Z3A39 39 weeks gestation of pregnancy: Secondary | ICD-10-CM | POA: Insufficient documentation

## 2021-02-23 MED ORDER — PROMETHAZINE HCL 25 MG/ML IJ SOLN
INTRAMUSCULAR | Status: AC
  Administered 2021-02-23: 12.5 mg via INTRAMUSCULAR
  Filled 2021-02-23: qty 1

## 2021-02-23 MED ORDER — MORPHINE SULFATE (PF) 10 MG/ML IV SOLN: 10.0000 mg | Freq: Once | INTRAVENOUS | Status: AC

## 2021-02-23 MED ORDER — PROMETHAZINE HCL 25 MG/ML IJ SOLN: 12.5000 mg | Freq: Once | INTRAMUSCULAR | Status: AC

## 2021-02-23 MED ORDER — MORPHINE SULFATE (PF) 10 MG/ML IV SOLN
INTRAVENOUS | Status: AC
  Administered 2021-02-23: 10 mg via INTRAMUSCULAR
  Filled 2021-02-23: qty 1

## 2021-02-23 NOTE — Progress Notes (Signed)
   02/23/21 1719  Provider Notification  Provider Name/Title Janee Morn  Date Provider Notified 02/23/21  Time Provider Notified 1719  Method of Notification Phone  Request Evaluate - remote  Notification Reason Patient request  Provider response Other (Comment) (options to discuss with pt., orders to be put in)   Patient is in tears, exhausted with prodromal labor and inability to sleep with the painful contractions. I asked her thoughts on a one time pain relief medication and patient was very favorable on the idea. I talked with CNM about the patient's status medically and current state mentally and Angelica Mason gave options for therapeutic rest either in the hospital or prescriptions to be sent home.

## 2021-02-23 NOTE — Progress Notes (Signed)
Patient taken off EFM to walk per CNM order. FHR strip reactive.

## 2021-02-23 NOTE — OB Triage Note (Signed)
    L&D OB Triage Note  SUBJECTIVE Angelica Mason is a 25 y.o. G3P0020 female at [redacted]w[redacted]d, EDD Estimated Date of Delivery: 02/26/21 who presented to triage with complaints of contraction. She has irregular contractions for the past few days. She denies leaking of fluid and vaginal bleeding. She complains of exhaution.   OB History  Gravida Para Term Preterm AB Living  3 0 0 0 2 0  SAB IAB Ectopic Multiple Live Births  0 0 0 0 0    # Outcome Date GA Lbr Len/2nd Weight Sex Delivery Anes PTL Lv  3 Current           2 AB           1 AB             Medications Prior to Admission  Medication Sig Dispense Refill Last Dose  . acetaminophen (TYLENOL) 325 MG tablet Take 650 mg by mouth every 6 (six) hours as needed.   02/23/2021 at Unknown time  . metoCLOPramide (REGLAN) 10 MG tablet Take 10 mg by mouth 4 (four) times daily.   Past Month at Unknown time  . Prenatal Vit-Fe Fumarate-FA (PRENATAL MULTIVITAMIN) TABS tablet Take 1 tablet by mouth daily at 12 noon.   02/22/2021 at Unknown time  . promethazine (PHENERGAN) 12.5 MG tablet Take 12.5 mg by mouth every 6 (six) hours as needed for nausea or vomiting.   02/23/2021 at Unknown time     OBJECTIVE  Nursing Evaluation:   BP 127/79   Pulse 89   Temp 99.1 F (37.3 C) (Oral)   Resp 17   Ht 5\' 3"  (1.6 m)   Wt 94.8 kg   LMP 05/22/2020   BMI 37.02 kg/m    Findings:        Reactive NST, irregular contractions.       NST was performed and has been reviewed by me.  NST INTERPRETATION: Category I  Mode: External Baseline Rate (A): 145 bpm Variability: Moderate Accelerations: 15 x 15 Decelerations: Variable     Contraction Frequency (min): 2-7  ASSESSMENT Impression:  1.  Pregnancy:  G3P0020 at [redacted]w[redacted]d , EDD Estimated Date of Delivery: 02/26/21 2.  Reassuring fetal and maternal status 3.  Prodromal labor  PLAN 1. Discussed current condition and above findings with patient and reassurance given.  All questions answered. 2.  Discussed options of therapeutic rest or discharge home. Due to exhaustion she request therapeutic rest.  3. Morphine and phenergan therapeutic rest ordered 4. Re evaluate in 4-6 hrs for labor   02/28/21, [facility]

## 2021-02-23 NOTE — Progress Notes (Signed)
   02/23/21 1432  Provider Notification  Provider Name/Title Janee Morn  Date Provider Notified 02/23/21  Time Provider Notified 1430  Method of Notification Phone  Request Evaluate - remote  Notification Reason Vaginal exam;Uterine activity;Order request  Provider response Evaluate remotely  Date of Provider Response 02/23/21  Time of Provider Response 1430    Patient arrived with c/o contractions every 4-5 minutes since around 2am. Patient desires a waterbirth. History reviewed with patient and midwife. Orders received. Patient denies vaginal bleeding. Membranes intact.

## 2021-02-24 DIAGNOSIS — O471 False labor at or after 37 completed weeks of gestation: Secondary | ICD-10-CM | POA: Diagnosis not present

## 2021-02-24 DIAGNOSIS — Z3A39 39 weeks gestation of pregnancy: Secondary | ICD-10-CM | POA: Diagnosis not present

## 2021-02-24 MED ORDER — ZOLPIDEM TARTRATE 5 MG PO TABS
ORAL_TABLET | ORAL | Status: AC
  Administered 2021-02-24: 5 mg
  Filled 2021-02-24: qty 1

## 2021-02-24 MED ORDER — ZOLPIDEM TARTRATE 5 MG PO TABS: 5.0000 mg | ORAL_TABLET | Freq: Once | ORAL | 0 refills | Status: DC

## 2021-02-24 MED ORDER — ZOLPIDEM TARTRATE 5 MG PO TABS: 5.0000 mg | ORAL_TABLET | Freq: Once | ORAL | Status: AC

## 2021-02-24 NOTE — Discharge Summary (Signed)
RN reviewed discharge instructions with patient. Gave patient opportunity for questions. All questions answered at this time. Pt verbalized understanding. Pt discharged home with significant other.  °

## 2021-02-24 NOTE — Progress Notes (Signed)
    L&D OB Triage Note  SUBJECTIVE Angelica Mason is a 25 y.o. G3P0020 female at [redacted]w[redacted]d, EDD Estimated Date of Delivery: 02/26/21 who presented to triage earlier this evening for evaluation of labor. Pt found to be in false labor with irregular ctx and no cervical change since last exam 2 days ago. She was given therapeutic rest and found  After 6 hours to have no cervical change.   OB History  Gravida Para Term Preterm AB Living  3 0 0 0 2 0  SAB IAB Ectopic Multiple Live Births  0 0 0 0 0    # Outcome Date GA Lbr Len/2nd Weight Sex Delivery Anes PTL Lv  3 Current           2 AB           1 AB             Medications Prior to Admission  Medication Sig Dispense Refill Last Dose  . acetaminophen (TYLENOL) 325 MG tablet Take 650 mg by mouth every 6 (six) hours as needed.   02/23/2021 at Unknown time  . metoCLOPramide (REGLAN) 10 MG tablet Take 10 mg by mouth 4 (four) times daily.   Past Month at Unknown time  . Prenatal Vit-Fe Fumarate-FA (PRENATAL MULTIVITAMIN) TABS tablet Take 1 tablet by mouth daily at 12 noon.   02/22/2021 at Unknown time  . promethazine (PHENERGAN) 12.5 MG tablet Take 12.5 mg by mouth every 6 (six) hours as needed for nausea or vomiting.   02/23/2021 at Unknown time     OBJECTIVE  Nursing Evaluation:   BP 126/72   Pulse 87   Temp 98.8 F (37.1 C) (Oral)   Resp 17   Ht 5\' 3"  (1.6 m)   Wt 94.8 kg   LMP 05/22/2020   BMI 37.02 kg/m    Findings:    Reactive, False labor, irregular contractions, no cervical change.       NST was performed and has been reviewed by me.  NST INTERPRETATION: Category I  Mode: External Baseline Rate (A): 135 bpm Variability: Moderate Accelerations: 15 x 15 Decelerations: Early     Contraction Frequency (min): rare  ASSESSMENT Impression:  1.  Pregnancy:  G3P0020 at [redacted]w[redacted]d , EDD Estimated Date of Delivery: 02/26/21 2.  Reassuring fetal and maternal status 3.  No cervical change  PLAN 1. Discussed current condition  and above findings with patient and reassurance given.  All questions answered. 2. Discharge home with standard labor precautions given to return to L&D or call the office for problems. 3. Continue routine prenatal care.     02/28/21, CNM

## 2021-02-25 ENCOUNTER — Ambulatory Visit (INDEPENDENT_AMBULATORY_CARE_PROVIDER_SITE_OTHER): Payer: Medicaid Other | Admitting: Certified Nurse Midwife

## 2021-02-25 ENCOUNTER — Other Ambulatory Visit: Payer: Self-pay

## 2021-02-25 ENCOUNTER — Telehealth: Payer: Self-pay

## 2021-02-25 ENCOUNTER — Inpatient Hospital Stay
Admission: EM | Admit: 2021-02-25 | Discharge: 2021-02-28 | DRG: 788 | Disposition: A | Discharge status: Home / Self Care | Payer: Medicaid Other | Attending: Certified Nurse Midwife | Admitting: Certified Nurse Midwife

## 2021-02-25 ENCOUNTER — Encounter: Payer: Self-pay | Admitting: Certified Nurse Midwife

## 2021-02-25 ENCOUNTER — Telehealth: Payer: Self-pay | Admitting: Certified Nurse Midwife

## 2021-02-25 VITALS — BP 106/71 | HR 94 | Wt 208.4 lb

## 2021-02-25 DIAGNOSIS — Z3A40 40 weeks gestation of pregnancy: Secondary | ICD-10-CM | POA: Diagnosis not present

## 2021-02-25 DIAGNOSIS — Z3403 Encounter for supervision of normal first pregnancy, third trimester: Secondary | ICD-10-CM

## 2021-02-25 DIAGNOSIS — Z20822 Contact with and (suspected) exposure to covid-19: Secondary | ICD-10-CM | POA: Diagnosis not present

## 2021-02-25 DIAGNOSIS — O24419 Gestational diabetes mellitus in pregnancy, unspecified control: Secondary | ICD-10-CM | POA: Diagnosis not present

## 2021-02-25 DIAGNOSIS — Z3A39 39 weeks gestation of pregnancy: Secondary | ICD-10-CM

## 2021-02-25 DIAGNOSIS — O26893 Other specified pregnancy related conditions, third trimester: Secondary | ICD-10-CM | POA: Diagnosis not present

## 2021-02-25 DIAGNOSIS — B951 Streptococcus, group B, as the cause of diseases classified elsewhere: Secondary | ICD-10-CM

## 2021-02-25 DIAGNOSIS — O99824 Streptococcus B carrier state complicating childbirth: Secondary | ICD-10-CM | POA: Diagnosis not present

## 2021-02-25 DIAGNOSIS — Z98891 History of uterine scar from previous surgery: Secondary | ICD-10-CM

## 2021-02-25 DIAGNOSIS — O368330 Maternal care for abnormalities of the fetal heart rate or rhythm, third trimester, not applicable or unspecified: Secondary | ICD-10-CM | POA: Diagnosis not present

## 2021-02-25 LAB — POCT URINALYSIS DIPSTICK OB
Bilirubin, UA: NEGATIVE
Glucose, UA: NEGATIVE
Ketones, UA: NEGATIVE
Nitrite, UA: NEGATIVE
Spec Grav, UA: 1.01 (ref 1.010–1.025)
Urobilinogen, UA: 0.2 E.U./dL
pH, UA: 6.5 (ref 5.0–8.0)

## 2021-02-25 LAB — TYPE AND SCREEN
ABO/RH(D): A POS
Antibody Screen: NEGATIVE

## 2021-02-25 LAB — CBC
HCT: 44.8 % (ref 36.0–46.0)
Hemoglobin: 15.1 g/dL — ABNORMAL HIGH (ref 12.0–15.0)
MCH: 29.7 pg (ref 26.0–34.0)
MCHC: 33.7 g/dL (ref 30.0–36.0)
MCV: 88.2 fL (ref 80.0–100.0)
Platelets: 136 10*3/uL — ABNORMAL LOW (ref 150–400)
RBC: 5.08 MIL/uL (ref 3.87–5.11)
RDW: 14.3 % (ref 11.5–15.5)
WBC: 12.6 10*3/uL — ABNORMAL HIGH (ref 4.0–10.5)
nRBC: 0 % (ref 0.0–0.2)

## 2021-02-25 LAB — RAPID HIV SCREEN (HIV 1/2 AB+AG)
HIV 1/2 Antibodies: NONREACTIVE
HIV-1 P24 Antigen - HIV24: NONREACTIVE

## 2021-02-25 LAB — RESP PANEL BY RT-PCR (FLU A&B, COVID) ARPGX2
Influenza A by PCR: NEGATIVE
Influenza B by PCR: NEGATIVE
SARS Coronavirus 2 by RT PCR: NEGATIVE

## 2021-02-25 MED ORDER — LACTATED RINGERS IV SOLN: INTRAVENOUS | Status: DC

## 2021-02-25 MED ORDER — MORPHINE SULFATE (PF) 4 MG/ML IV SOLN
4.0000 mg | Freq: Once | INTRAVENOUS | Status: AC
  Administered 2021-02-25: 4 mg via INTRAVENOUS
  Filled 2021-02-25: qty 1

## 2021-02-25 MED ORDER — SOD CITRATE-CITRIC ACID 500-334 MG/5ML PO SOLN
30.0000 mL | ORAL | Status: DC | PRN
  Filled 2021-02-25: qty 15

## 2021-02-25 MED ORDER — OXYTOCIN 10 UNIT/ML IJ SOLN
INTRAMUSCULAR | Status: AC
  Filled 2021-02-25: qty 2

## 2021-02-25 MED ORDER — OXYCODONE-ACETAMINOPHEN 5-325 MG PO TABS: 2.0000 | ORAL_TABLET | ORAL | Status: DC | PRN

## 2021-02-25 MED ORDER — PROMETHAZINE HCL 25 MG/ML IJ SOLN
12.5000 mg | Freq: Once | INTRAMUSCULAR | Status: AC
  Administered 2021-02-25: 12.5 mg via INTRAMUSCULAR
  Filled 2021-02-25: qty 1

## 2021-02-25 MED ORDER — ONDANSETRON HCL 4 MG/2ML IJ SOLN
4.0000 mg | Freq: Four times a day (QID) | INTRAMUSCULAR | Status: DC | PRN
  Administered 2021-02-25: 4 mg via INTRAVENOUS
  Filled 2021-02-25: qty 2

## 2021-02-25 MED ORDER — MORPHINE SULFATE (PF) 4 MG/ML IV SOLN: 4.0000 mg | Freq: Once | INTRAVENOUS | Status: DC

## 2021-02-25 MED ORDER — ACETAMINOPHEN 325 MG PO TABS: 650.0000 mg | ORAL_TABLET | ORAL | Status: DC | PRN

## 2021-02-25 MED ORDER — LACTATED RINGERS IV SOLN
500.0000 mL | INTRAVENOUS | Status: DC | PRN
  Administered 2021-02-26: 500 mL via INTRAVENOUS

## 2021-02-25 MED ORDER — BUTORPHANOL TARTRATE 1 MG/ML IJ SOLN
1.0000 mg | INTRAMUSCULAR | Status: DC | PRN
  Administered 2021-02-25: 1 mg via INTRAVENOUS
  Filled 2021-02-25: qty 1

## 2021-02-25 MED ORDER — MISOPROSTOL 200 MCG PO TABS
ORAL_TABLET | ORAL | Status: AC
  Filled 2021-02-25: qty 4

## 2021-02-25 MED ORDER — OXYCODONE-ACETAMINOPHEN 5-325 MG PO TABS: 1.0000 | ORAL_TABLET | ORAL | Status: DC | PRN

## 2021-02-25 MED ORDER — AMMONIA AROMATIC IN INHA
RESPIRATORY_TRACT | Status: AC
  Filled 2021-02-25: qty 10

## 2021-02-25 MED ORDER — OXYTOCIN-SODIUM CHLORIDE 30-0.9 UT/500ML-% IV SOLN
1.0000 m[IU]/min | INTRAVENOUS | Status: DC
  Administered 2021-02-25: 2 m[IU]/min via INTRAVENOUS

## 2021-02-25 MED ORDER — PENICILLIN G POT IN DEXTROSE 60000 UNIT/ML IV SOLN
3.0000 10*6.[IU] | INTRAVENOUS | Status: DC
  Administered 2021-02-25 – 2021-02-26 (×3): 3 10*6.[IU] via INTRAVENOUS
  Filled 2021-02-25 (×3): qty 50

## 2021-02-25 MED ORDER — OXYTOCIN BOLUS FROM INFUSION: 333.0000 mL | Freq: Once | INTRAVENOUS | Status: DC

## 2021-02-25 MED ORDER — ZOLPIDEM TARTRATE 5 MG PO TABS: 5.0000 mg | ORAL_TABLET | Freq: Every evening | ORAL | Status: DC | PRN

## 2021-02-25 MED ORDER — SODIUM CHLORIDE 0.9 % IV SOLN
5.0000 10*6.[IU] | Freq: Once | INTRAVENOUS | Status: AC
  Administered 2021-02-25: 5 10*6.[IU] via INTRAVENOUS
  Filled 2021-02-25: qty 5

## 2021-02-25 MED ORDER — LIDOCAINE HCL (PF) 1 % IJ SOLN
30.0000 mL | INTRAMUSCULAR | Status: DC | PRN
  Filled 2021-02-25: qty 30

## 2021-02-25 MED ORDER — TERBUTALINE SULFATE 1 MG/ML IJ SOLN: 0.2500 mg | Freq: Once | INTRAMUSCULAR | Status: DC | PRN

## 2021-02-25 MED ORDER — OXYTOCIN-SODIUM CHLORIDE 30-0.9 UT/500ML-% IV SOLN
INTRAVENOUS | Status: AC
  Filled 2021-02-25: qty 500

## 2021-02-25 MED ORDER — OXYTOCIN-SODIUM CHLORIDE 30-0.9 UT/500ML-% IV SOLN: 2.5000 [IU]/h | INTRAVENOUS | Status: DC

## 2021-02-25 NOTE — Progress Notes (Signed)
LABOR CHECK-Reports contractions every three (3) to four (4) minutes for the last five (5) days. Sent home from triage twice last week. Denies LOF or vaginal bleeding. Discussed early labor management options with patient. No relief with home therapeutic rest medications. Wishes to go to labor and delivery for therapeutic rest. Limited exam. Report call to [employer].

## 2021-02-25 NOTE — Telephone Encounter (Signed)
Transition Care Management Unsuccessful Follow-up Telephone Call  Date of discharge and from where:  02/24/2021 from [facility]  Attempts:  1st Attempt  Reason for unsuccessful TCM follow-up call:  Left voice message     

## 2021-02-25 NOTE — Telephone Encounter (Signed)
Transition Care Management Follow-up Telephone Call  Date of discharge and from where: 02/24/2021 from [facility]   How have you been since you were released from the hospital? Pt states that she is still having contraction. Pt stated that she has someone with her and they are helping. Pt has been tracking the contractions and duration of each to report to OBGYN. Pt is calling OBGYN now.   Any questions or concerns? No  Items Reviewed:  Did the pt receive and understand the discharge instructions provided? Yes   Medications obtained and verified? Yes   Other? No   Any new allergies since your discharge? No   Dietary orders reviewed? N/A  Do you have support at home? Yes   Functional Questionnaire: (I = Independent and D = Dependent) ADLs: I  Bathing/Dressing- I  Meal Prep- I  Eating- I  Maintaining continence- I  Transferring/Ambulation- I  Managing Meds- I   Follow up appointments reviewed:   Are transportation arrangements needed? No   If their condition worsens, is the pt aware to call PCP or go to the Emergency Dept.? Yes  Was the patient provided with contact information for the PCP's office or ED? Yes  Was to pt encouraged to call back with questions or concerns? Yes

## 2021-02-25 NOTE — Telephone Encounter (Signed)
OB 39 6/7  C/O ctx q 4-5 minutes apart for the last 13H. She is unable to sleep d/t to the pain. Crying and talking through ctx.   No VB No LOF + FM  Tylenol and ambien not helping for pain or rest. Was seen at [facility] on 3/17 and  3/19.  Reactive NST x 2. NO change in her cervix  Per pt.   Appt made to see JML today at 2pm .

## 2021-02-25 NOTE — OB Triage Note (Signed)
Pt Angelica Mason 25 y.o. was sent to L&D by Desiree Lucy . Pt is a G3P0020 at [redacted]w[redacted]d . Pt denies signs and symptons consistent with rupture of membranes or active vaginal bleeding. Pt reports contractions q3-4 minutes and rating them 9/10. Pt  reports positive fetal movement. External FM and TOCO applied to non-tender abdomen and assessing. Initial FHR 150 . Vital signs obtained and within normal limits. Provider notified of pt and orders released.

## 2021-02-25 NOTE — Progress Notes (Signed)
Patient ID: Angelica Mason, female   DOB: [birth year], 25 y.o.   MRN: [id number]  Angelica Mason is a 25 y.o. G3P0020 at [redacted]w[redacted]d by LMP admitted for labor management  Subjective:  Patient in shower, breathing through contractions.   Denies difficulty breathing or respiratory distress, chest pain, dysuria, and leg pain.   FOB at bedside for continuous labor support.  Objective:   Temp:  [97.6 F (36.4 C)-98.2 F (36.8 C)] 97.6 F (36.4 C) (03/21 2152) Pulse Rate:  [81-102] 82 (03/21 2152) Resp:  [18] 18 (03/21 1907) BP: (106-146)/(52-83) 135/68 (03/21 2152) Weight:  [94.5 kg-95 kg] 95 kg (03/21 1530)  Fetal Wellbeing:  Category II  UC:   regular, every two (2) to three (3) minutes; soft resting tone, pitocin infusing at 6 mu/min  SVE:   Dilation: 5.5 Effacement (%): 70,80 Station: -2 Exam by:: Anastasia Pall RN  Labs: Lab Results  Component Value Date   WBC 12.6 (H) 02/25/2021   HGB 15.1 (H) 02/25/2021   HCT 44.8 02/25/2021   MCV 88.2 02/25/2021   PLT 136 (L) 02/25/2021    Assessment:  Angelica Prom Donaldsonis a 25 y.o.G3P0020 at [redacted]w[redacted]d being admitted for labor management, Rh positive, GBS positive  FHR Category II  Plan:  Encouraged position change and use of other non-pharmacologic pain management techniques.   Reviewed red flag symptoms and when to call.   Continue orders as written. Reassess as needed.    Angelica Mason, CNM [facility], [facility] 02/25/2021, 11:44 PM

## 2021-02-25 NOTE — Telephone Encounter (Signed)
New Message:   Pt calling states she is having contractions. Contractions every 3-4 minutes.

## 2021-02-25 NOTE — Anesthesia Preprocedure Evaluation (Signed)
Anesthesia Evaluation  Patient identified by MRN, date of birth, ID band Patient awake    Reviewed: Allergy & Precautions, H&P , NPO status , Patient's Chart, lab work & pertinent test results, reviewed documented beta blocker date and time   Airway Mallampati: II  TM Distance: >3 FB Neck ROM: full    Dental no notable dental hx. (+) Teeth Intact   Pulmonary neg pulmonary ROS, Current Smoker,    Pulmonary exam normal breath sounds clear to auscultation       Cardiovascular Exercise Tolerance: Good negative cardio ROS   Rhythm:regular Rate:Normal     Neuro/Psych negative neurological ROS  negative psych ROS   GI/Hepatic negative GI ROS, Neg liver ROS,   Endo/Other  negative endocrine ROSdiabetes, Gestational  Renal/GU      Musculoskeletal   Abdominal   Peds  Hematology negative hematology ROS (+)   Anesthesia Other Findings   Reproductive/Obstetrics (+) Pregnancy                             Anesthesia Physical Anesthesia Plan  ASA: II  Anesthesia Plan: Epidural   Post-op Pain Management:    Induction:   PONV Risk Score and Plan:   Airway Management Planned:   Additional Equipment:   Intra-op Plan:   Post-operative Plan:   Informed Consent: I have reviewed the patients History and Physical, chart, labs and discussed the procedure including the risks, benefits and alternatives for the proposed anesthesia with the patient or authorized representative who has indicated his/her understanding and acceptance.       Plan Discussed with:   Anesthesia Plan Comments:         Anesthesia Quick Evaluation  

## 2021-02-25 NOTE — H&P (Signed)
Obstetric History and Physical  Angelica Mason is a 25 y.o. G3P0020 with IUP at [redacted]w[redacted]d presenting with irregular contractions for the last five (5) days.   Patient states she has been having  regular, every three (3) to four (4) minutes contractions, none vaginal bleeding, intact membranes, with active fetal movement.    Denies difficulty breathing or respiratory distress, chest pain, dysuria, and leg pain.   Prenatal Course Source of Care: Transfer of care from [facility] at 32 weeks, EWC: 7  Pregnancy complications or risks: Group beta strep positive  Prenatal labs and studies:  ABO, Rh: --/--/A POS (03/21 1603)  Antibody: NEG (03/21 1603)  Rubella: pending  Varicella: pending  RPR: pending  HBsAg: pending  HIV: pending  ATF:TDDUKGUR/-- (02/24 1152)  1 hr Glucola: 124 (12/28)  Genetic screening: declined  Anatomy US: Complete, normal (12/14)  Past Medical History:  Diagnosis Date  . Ovarian cyst     Past Surgical History:  Procedure Laterality Date  . NO PAST SURGERIES      OB History  Gravida Para Term Preterm AB Living  3       2    SAB IAB Ectopic Multiple Live Births               # Outcome Date GA Lbr Len/2nd Weight Sex Delivery Anes PTL Lv  3 Current           2 AB           1 AB             Social History   Socioeconomic History  . Marital status: Single    Spouse name: Not on file  . Number of children: Not on file  . Years of education: Not on file  . Highest education level: Not on file  Occupational History  . Not on file  Tobacco Use  . Smoking status: Never Smoker  . Smokeless tobacco: Never Used  Substance and Sexual Activity  . Alcohol use: Not Currently  . Drug use: Not Currently  . Sexual activity: Yes    Partners: Male    Birth control/protection: None  Other Topics Concern  . Not on file  Social History Narrative  . Not on file   Social Determinants of Health   Financial Resource Strain: Not on file  Food  Insecurity: Not on file  Transportation Needs: Not on file  Physical Activity: Not on file  Stress: Not on file  Social Connections: Not on file    Family History  Problem Relation Age of Onset  . Cancer Neg Hx     Medications Prior to Admission  Medication Sig Dispense Refill Last Dose  . acetaminophen (TYLENOL) 325 MG tablet Take 650 mg by mouth every 6 (six) hours as needed.   02/24/2021 at Unknown time  . metoCLOPramide (REGLAN) 10 MG tablet Take 10 mg by mouth 4 (four) times daily.   02/24/2021 at Unknown time  . Prenatal Vit-Fe Fumarate-FA (PRENATAL MULTIVITAMIN) TABS tablet Take 1 tablet by mouth daily at 12 noon.   02/24/2021 at Unknown time  . promethazine (PHENERGAN) 12.5 MG tablet Take 12.5 mg by mouth every 6 (six) hours as needed for nausea or vomiting.   02/24/2021 at Unknown time  . zolpidem (AMBIEN) 5 MG tablet Take 1 tablet (5 mg total) by mouth once for 1 dose. 5 tablet 0     Allergies  Allergen Reactions  . Toradol [Ketorolac Tromethamine] Itching  Review of Systems: Negative except for what is mentioned in HPI.  Physical Exam:  Temp:  [98.2 F (36.8 C)] 98.2 F (36.8 C) (03/21 1453) Pulse Rate:  [94-102] 102 (03/21 1453) Resp:  [18] 18 (03/21 1453) BP: (106-146)/(71-83) 146/83 (03/21 1453) Weight:  [94.5 kg-95 kg] 95 kg (03/21 1530)  GENERAL: Well-developed, well-nourished female in no acute distress.   LUNGS: Clear to auscultation bilaterally.   HEART: Regular rate and rhythm.  ABDOMEN: Soft, nontender, nondistended, gravid.  EXTREMITIES: Nontender, no edema, 2+ distal pulses.  Cervical Exam: Dilation: 4.5 Effacement (%): 50 Cervical Position: Posterior Station: -2 Presentation: Vertex Exam by:: michelle Aryan Sparks,CNM   FHR Category II, intermittent late and variable decelerations  Contractions: Every six (6) to eight (8) minutes, soft resting tone   Pertinent Labs/Studies:   Results for orders placed or performed during the hospital  encounter of 02/25/21 (from the past 24 hour(s))  CBC     Status: Abnormal   Collection Time: 02/25/21  3:19 PM  Result Value Ref Range   WBC 12.6 (H) 4.0 - 10.5 K/uL   RBC 5.08 3.87 - 5.11 MIL/uL   Hemoglobin 15.1 (H) 12.0 - 15.0 g/dL   HCT 65.7 84.6 - 96.2 %   MCV 88.2 80.0 - 100.0 fL   MCH 29.7 26.0 - 34.0 pg   MCHC 33.7 30.0 - 36.0 g/dL   RDW 95.2 84.1 - 32.4 %   Platelets 136 (L) 150 - 400 K/uL   nRBC 0.0 0.0 - 0.2 %  Type and screen     Status: None (Preliminary result)   Collection Time: 02/25/21  3:19 PM  Result Value Ref Range   ABO/RH(D) PENDING    Antibody Screen PENDING    Sample Expiration      02/28/2021,2359 Performed at [facility], [address] [place], [place] [address]   Type and screen     Status: None   Collection Time: 02/25/21  4:03 PM  Result Value Ref Range   ABO/RH(D) A POS    Antibody Screen NEG    Sample Expiration      02/28/2021,2359 Performed at [facility], [address], [place], [place] [address]   Resp Panel by RT-PCR (Flu A&B, Covid) Nasopharyngeal Swab     Status: None   Collection Time: 02/25/21  4:26 PM   Specimen: Nasopharyngeal Swab; Nasopharyngeal(NP) swabs in vial transport medium  Result Value Ref Range   SARS Coronavirus 2 by RT PCR NEGATIVE NEGATIVE   Influenza A by PCR NEGATIVE NEGATIVE   Influenza B by PCR NEGATIVE NEGATIVE    Assessment :  Angelica Mason is a 25 y.o. G3P0020 at [redacted]w[redacted]d being admitted for labor management, Rh positive, GBS positive  FHR Category II  Plan:  Admit to birthing suites, see orders.   Labor: Expectant management.  Induction/Augmentation as needed, per protocol.   Discussed fetal heart rate tracing with patient. Recommended against the use of intermittent auscultation and tub in the setting of Category II tracing, patient and spouse verbalized understanding.   Plan: Guarded for vaginal birth.   Dr. Valentino Saxon notified of admission and plan of care. Recommended  augmentation if intermittent late decelerations persists.    Gunnar Bulla, CNM [facility], [facility] 02/25/21 6:44 PM

## 2021-02-25 NOTE — Progress Notes (Signed)
Patient ID: Angelica Mason, female   DOB: 10-11-1996, 25 y.o.   MRN: [id number]  Angelica Mason is a 25 y.o. G3P0020 at [redacted]w[redacted]d by LMP admitted for labor management  Subjective:  Patient reports irregular, painful contractions. Spouse at bedside for continuous labor support.   Denies difficulty breathing or respiratory distress, chest pain, dysuria, and leg pain.   Objective:  Temp:  [98 F (36.7 C)-98.2 F (36.8 C)] 98 F (36.7 C) (03/21 1907) Pulse Rate:  [81-102] 93 (03/21 1907) Resp:  [18] 18 (03/21 1907) BP: (106-146)/(52-83) 132/77 (03/21 1907) Weight:  [94.5 kg-95 kg] 95 kg (03/21 1530)  Fetal Wellbeing:  Category I  UC:   irregular, every five (5) to seven (7) minutes; soft resting tone  SVE:   Dilation: 4.5 Effacement (%): 50 Station: -2 Exam by:: michelle lawhorn,CNM  Labs: Lab Results  Component Value Date   WBC 12.6 (H) 02/25/2021   HGB 15.1 (H) 02/25/2021   HCT 44.8 02/25/2021   MCV 88.2 02/25/2021   PLT 136 (L) 02/25/2021    Assessment:  Angelica Mason is a 25 y.o. G3P0020 at 106w6d being admitted for labor management, Rh positive, GBS positive  FHR Category II  Plan:  Discussed fetal heart rate tracing as well as actual vs expected labor progression. Patient agrees to augmentation with pitocin at this time, see MAR.   Reviewed red flag symptoms and when to call.   Continue orders as written. Reassess as needed.   Update given to Dr. Valentino Saxon.    Angelica Mason, CNM [facility], [facility] 02/25/2021, 8:11 PM

## 2021-02-26 ENCOUNTER — Encounter
Admission: EM | Disposition: A | Discharge status: Home / Self Care | Payer: Self-pay | Source: Home / Self Care | Attending: Certified Nurse Midwife

## 2021-02-26 ENCOUNTER — Inpatient Hospital Stay: Payer: Medicaid Other | Admitting: Anesthesiology

## 2021-02-26 ENCOUNTER — Encounter: Payer: Self-pay | Admitting: Certified Nurse Midwife

## 2021-02-26 DIAGNOSIS — O368330 Maternal care for abnormalities of the fetal heart rate or rhythm, third trimester, not applicable or unspecified: Secondary | ICD-10-CM

## 2021-02-26 DIAGNOSIS — O99824 Streptococcus B carrier state complicating childbirth: Secondary | ICD-10-CM

## 2021-02-26 DIAGNOSIS — Z98891 History of uterine scar from previous surgery: Secondary | ICD-10-CM

## 2021-02-26 DIAGNOSIS — Z3A40 40 weeks gestation of pregnancy: Secondary | ICD-10-CM

## 2021-02-26 DIAGNOSIS — O24419 Gestational diabetes mellitus in pregnancy, unspecified control: Secondary | ICD-10-CM | POA: Diagnosis not present

## 2021-02-26 LAB — HEPATITIS B SURFACE ANTIGEN: Hepatitis B Surface Ag: NONREACTIVE

## 2021-02-26 LAB — RPR: RPR Ser Ql: NONREACTIVE

## 2021-02-26 SURGERY — Surgical Case
Anesthesia: Epidural

## 2021-02-26 MED ORDER — ONDANSETRON HCL 4 MG/2ML IJ SOLN: 4.0000 mg | Freq: Three times a day (TID) | INTRAMUSCULAR | Status: DC | PRN

## 2021-02-26 MED ORDER — MENTHOL 3 MG MT LOZG
1.0000 | LOZENGE | OROMUCOSAL | Status: DC | PRN
  Filled 2021-02-26: qty 9

## 2021-02-26 MED ORDER — SODIUM CHLORIDE 0.9 % IV SOLN
INTRAVENOUS | Status: DC | PRN
  Administered 2021-02-26: 40 ug/min via INTRAVENOUS

## 2021-02-26 MED ORDER — ZOLPIDEM TARTRATE 5 MG PO TABS: 5.0000 mg | ORAL_TABLET | Freq: Every evening | ORAL | Status: DC | PRN

## 2021-02-26 MED ORDER — PHENYLEPHRINE 40 MCG/ML (10ML) SYRINGE FOR IV PUSH (FOR BLOOD PRESSURE SUPPORT)
PREFILLED_SYRINGE | INTRAVENOUS | Status: DC | PRN
  Administered 2021-02-26 (×2): 100 ug via INTRAVENOUS

## 2021-02-26 MED ORDER — BUPIVACAINE LIPOSOME 1.3 % IJ SUSP
INTRAMUSCULAR | Status: AC
  Filled 2021-02-26: qty 20

## 2021-02-26 MED ORDER — COCONUT OIL OIL
1.0000 "application " | TOPICAL_OIL | Status: DC | PRN
  Filled 2021-02-26: qty 120

## 2021-02-26 MED ORDER — OXYCODONE-ACETAMINOPHEN 5-325 MG PO TABS: 2.0000 | ORAL_TABLET | ORAL | Status: DC | PRN

## 2021-02-26 MED ORDER — KETAMINE HCL 50 MG/ML IJ SOLN
INTRAMUSCULAR | Status: DC | PRN
  Administered 2021-02-26 (×2): 12.5 mg via INTRAMUSCULAR

## 2021-02-26 MED ORDER — FENTANYL CITRATE (PF) 100 MCG/2ML IJ SOLN
50.0000 ug | Freq: Once | INTRAMUSCULAR | Status: AC
Start: 2021-02-26 — End: 2021-02-26
  Administered 2021-02-26: 50 ug via INTRAVENOUS

## 2021-02-26 MED ORDER — SODIUM CHLORIDE 0.9% FLUSH: 3.0000 mL | INTRAVENOUS | Status: DC | PRN

## 2021-02-26 MED ORDER — FENTANYL 2.5 MCG/ML W/ROPIVACAINE 0.15% IN NS 100 ML EPIDURAL (ARMC)
EPIDURAL | Status: DC | PRN
  Administered 2021-02-26: 12 mL/h via EPIDURAL

## 2021-02-26 MED ORDER — FENTANYL CITRATE (PF) 100 MCG/2ML IJ SOLN
INTRAMUSCULAR | Status: AC
  Filled 2021-02-26: qty 2

## 2021-02-26 MED ORDER — DIPHENHYDRAMINE HCL 25 MG PO CAPS: 25.0000 mg | ORAL_CAPSULE | ORAL | Status: DC | PRN

## 2021-02-26 MED ORDER — OXYCODONE HCL 5 MG PO TABS: 5.0000 mg | ORAL_TABLET | ORAL | Status: DC | PRN

## 2021-02-26 MED ORDER — IBUPROFEN 800 MG PO TABS
800.0000 mg | ORAL_TABLET | Freq: Three times a day (TID) | ORAL | Status: DC
  Administered 2021-02-26 – 2021-02-28 (×6): 800 mg via ORAL
  Filled 2021-02-26 (×7): qty 1

## 2021-02-26 MED ORDER — LIDOCAINE HCL (PF) 1 % IJ SOLN
INTRAMUSCULAR | Status: DC | PRN
  Administered 2021-02-26: 3 mL via SUBCUTANEOUS

## 2021-02-26 MED ORDER — CHLOROPROCAINE HCL (PF) 3 % IJ SOLN
INTRAMUSCULAR | Status: DC | PRN
  Administered 2021-02-26 (×3): 5 mL via EPIDURAL
  Administered 2021-02-26: 3 mL via EPIDURAL

## 2021-02-26 MED ORDER — LACTATED RINGERS IV SOLN: INTRAVENOUS | Status: DC

## 2021-02-26 MED ORDER — DIPHENHYDRAMINE HCL 25 MG PO CAPS: 25.0000 mg | ORAL_CAPSULE | Freq: Four times a day (QID) | ORAL | Status: DC | PRN

## 2021-02-26 MED ORDER — BUPIVACAINE HCL 0.5 % IJ SOLN
INTRAMUSCULAR | Status: DC | PRN
  Administered 2021-02-26: 30 mL

## 2021-02-26 MED ORDER — BUPIVACAINE HCL (PF) 0.25 % IJ SOLN
INTRAMUSCULAR | Status: DC | PRN
  Administered 2021-02-26: 5 mL via EPIDURAL
  Administered 2021-02-26: 3 mL via EPIDURAL

## 2021-02-26 MED ORDER — SODIUM CHLORIDE 0.9 % IV SOLN
500.0000 mg | Freq: Once | INTRAVENOUS | Status: DC
  Filled 2021-02-26: qty 500

## 2021-02-26 MED ORDER — GABAPENTIN 300 MG PO CAPS
300.0000 mg | ORAL_CAPSULE | Freq: Two times a day (BID) | ORAL | Status: DC
  Administered 2021-02-26 – 2021-02-28 (×5): 300 mg via ORAL
  Filled 2021-02-26 (×5): qty 1

## 2021-02-26 MED ORDER — SIMETHICONE 80 MG PO CHEW
80.0000 mg | CHEWABLE_TABLET | ORAL | Status: DC | PRN
  Administered 2021-02-26 – 2021-02-27 (×3): 80 mg via ORAL
  Filled 2021-02-26 (×3): qty 1

## 2021-02-26 MED ORDER — KETAMINE HCL 50 MG/ML IJ SOLN
INTRAMUSCULAR | Status: AC
  Filled 2021-02-26: qty 10

## 2021-02-26 MED ORDER — ACETAMINOPHEN 325 MG PO TABS
650.0000 mg | ORAL_TABLET | Freq: Four times a day (QID) | ORAL | Status: DC
  Administered 2021-02-26 (×3): 650 mg via ORAL
  Filled 2021-02-26 (×4): qty 2

## 2021-02-26 MED ORDER — OXYTOCIN-SODIUM CHLORIDE 30-0.9 UT/500ML-% IV SOLN: 2.5000 [IU]/h | INTRAVENOUS | Status: AC

## 2021-02-26 MED ORDER — SOD CITRATE-CITRIC ACID 500-334 MG/5ML PO SOLN
30.0000 mL | ORAL | Status: AC
Start: 2021-02-26 — End: 2021-02-26
  Administered 2021-02-26: 30 mL via ORAL

## 2021-02-26 MED ORDER — DIPHENHYDRAMINE HCL 50 MG/ML IJ SOLN: 12.5000 mg | INTRAMUSCULAR | Status: DC | PRN

## 2021-02-26 MED ORDER — LACTATED RINGERS IV SOLN: INTRAVENOUS | Status: DC | PRN

## 2021-02-26 MED ORDER — OXYTOCIN-SODIUM CHLORIDE 30-0.9 UT/500ML-% IV SOLN
INTRAVENOUS | Status: DC | PRN
  Administered 2021-02-26: 1 mL via INTRAVENOUS
  Administered 2021-02-26: 399 mL via INTRAVENOUS

## 2021-02-26 MED ORDER — MIDAZOLAM HCL 2 MG/2ML IJ SOLN
INTRAMUSCULAR | Status: DC | PRN
  Administered 2021-02-26 (×2): .5 mg via INTRAVENOUS
  Administered 2021-02-26: 1 mg via INTRAVENOUS

## 2021-02-26 MED ORDER — DIBUCAINE (PERIANAL) 1 % EX OINT
1.0000 | TOPICAL_OINTMENT | CUTANEOUS | Status: DC | PRN
Start: 2021-02-26 — End: 2021-02-28

## 2021-02-26 MED ORDER — NALBUPHINE HCL 10 MG/ML IJ SOLN
5.0000 mg | Freq: Once | INTRAMUSCULAR | Status: DC | PRN
Start: 2021-02-26 — End: 2021-02-28

## 2021-02-26 MED ORDER — HYDROMORPHONE HCL 1 MG/ML IJ SOLN
1.0000 mg | INTRAMUSCULAR | Status: DC | PRN
Start: 2021-02-26 — End: 2021-02-28

## 2021-02-26 MED ORDER — SODIUM CHLORIDE (PF) 0.9 % IJ SOLN
INTRAMUSCULAR | Status: AC
  Filled 2021-02-26: qty 50

## 2021-02-26 MED ORDER — CEFAZOLIN SODIUM-DEXTROSE 2-4 GM/100ML-% IV SOLN
2.0000 g | INTRAVENOUS | Status: AC
  Administered 2021-02-26: 2 g via INTRAVENOUS

## 2021-02-26 MED ORDER — FENTANYL CITRATE (PF) 100 MCG/2ML IJ SOLN
INTRAMUSCULAR | Status: DC | PRN
  Administered 2021-02-26 (×2): 50 ug via INTRAVENOUS

## 2021-02-26 MED ORDER — LACTATED RINGERS AMNIOINFUSION
INTRAVENOUS | Status: DC
  Filled 2021-02-26 (×2): qty 1000

## 2021-02-26 MED ORDER — NALBUPHINE HCL 10 MG/ML IJ SOLN: 5.0000 mg | INTRAMUSCULAR | Status: DC | PRN

## 2021-02-26 MED ORDER — TRAMADOL HCL 50 MG PO TABS
50.0000 mg | ORAL_TABLET | Freq: Four times a day (QID) | ORAL | Status: DC | PRN
Start: 2021-02-26 — End: 2021-02-28

## 2021-02-26 MED ORDER — PRENATAL MULTIVITAMIN CH
1.0000 | ORAL_TABLET | Freq: Every day | ORAL | Status: DC
  Administered 2021-02-26 – 2021-02-28 (×3): 1 via ORAL
  Filled 2021-02-26 (×3): qty 1

## 2021-02-26 MED ORDER — SENNOSIDES-DOCUSATE SODIUM 8.6-50 MG PO TABS
2.0000 | ORAL_TABLET | Freq: Every day | ORAL | Status: DC
  Administered 2021-02-27 – 2021-02-28 (×2): 2 via ORAL
  Filled 2021-02-26 (×2): qty 2

## 2021-02-26 MED ORDER — WITCH HAZEL-GLYCERIN EX PADS: 1.0000 "application " | MEDICATED_PAD | CUTANEOUS | Status: DC | PRN

## 2021-02-26 MED ORDER — MIDAZOLAM HCL 2 MG/2ML IJ SOLN
INTRAMUSCULAR | Status: AC
  Filled 2021-02-26: qty 2

## 2021-02-26 MED ORDER — OXYCODONE HCL 5 MG PO TABS
10.0000 mg | ORAL_TABLET | ORAL | Status: DC | PRN
  Administered 2021-02-26 – 2021-02-28 (×9): 10 mg via ORAL
  Filled 2021-02-26 (×9): qty 2

## 2021-02-26 MED ORDER — BUPIVACAINE HCL (PF) 0.5 % IJ SOLN
INTRAMUSCULAR | Status: AC
  Filled 2021-02-26: qty 30

## 2021-02-26 MED ORDER — NALOXONE HCL 0.4 MG/ML IJ SOLN: 0.4000 mg | INTRAMUSCULAR | Status: DC | PRN

## 2021-02-26 MED ORDER — BUPIVACAINE LIPOSOME 1.3 % IJ SUSP
INTRAMUSCULAR | Status: DC | PRN
  Administered 2021-02-26: 20 mL

## 2021-02-26 MED ORDER — NALOXONE HCL 4 MG/10ML IJ SOLN
1.0000 ug/kg/h | INTRAVENOUS | Status: DC | PRN
  Filled 2021-02-26: qty 5

## 2021-02-26 MED ORDER — MAGNESIUM HYDROXIDE 400 MG/5ML PO SUSP: 30.0000 mL | ORAL | Status: DC | PRN

## 2021-02-26 MED ORDER — FENTANYL 2.5 MCG/ML W/ROPIVACAINE 0.15% IN NS 100 ML EPIDURAL (ARMC)
EPIDURAL | Status: AC
  Filled 2021-02-26: qty 100

## 2021-02-26 MED ORDER — FERROUS SULFATE 325 (65 FE) MG PO TABS
325.0000 mg | ORAL_TABLET | Freq: Two times a day (BID) | ORAL | Status: DC
  Administered 2021-02-26 – 2021-02-28 (×5): 325 mg via ORAL
  Filled 2021-02-26 (×5): qty 1

## 2021-02-26 MED ORDER — LIDOCAINE-EPINEPHRINE (PF) 1.5 %-1:200000 IJ SOLN
INTRAMUSCULAR | Status: DC | PRN
  Administered 2021-02-26: 4 mL via EPIDURAL

## 2021-02-26 MED ORDER — CEFAZOLIN SODIUM-DEXTROSE 2-4 GM/100ML-% IV SOLN
INTRAVENOUS | Status: AC
  Filled 2021-02-26: qty 100

## 2021-02-26 MED ORDER — OXYTOCIN-SODIUM CHLORIDE 30-0.9 UT/500ML-% IV SOLN
INTRAVENOUS | Status: AC
  Administered 2021-02-26: 2.5 [IU]/h via INTRAVENOUS
  Filled 2021-02-26: qty 500

## 2021-02-26 MED ORDER — NALBUPHINE HCL 10 MG/ML IJ SOLN: 5.0000 mg | Freq: Once | INTRAMUSCULAR | Status: DC | PRN

## 2021-02-26 MED ORDER — BUPIVACAINE LIPOSOME 1.3 % IJ SUSP: 20.0000 mL | Freq: Once | INTRAMUSCULAR | Status: DC

## 2021-02-26 SURGICAL SUPPLY — 33 items
BAG COUNTER SPONGE EZ (MISCELLANEOUS) ×2 IMPLANT
CANISTER SUCT 3000ML PPV (MISCELLANEOUS) ×2 IMPLANT
CELL SAVER LIPIGURD (MISCELLANEOUS) ×1 IMPLANT
CHLORAPREP W/TINT 26 (MISCELLANEOUS) ×4 IMPLANT
COVER WAND RF STERILE (DRAPES) ×2 IMPLANT
DRESSING TELFA 8X10 (GAUZE/BANDAGES/DRESSINGS) ×2 IMPLANT
DRSG TELFA 3X8 NADH (GAUZE/BANDAGES/DRESSINGS) ×2 IMPLANT
ELECT REM PT RETURN 9FT ADLT (ELECTROSURGICAL) ×2
ELECTRODE REM PT RTRN 9FT ADLT (ELECTROSURGICAL) ×1 IMPLANT
EXTRT SYSTEM ALEXIS 14CM (MISCELLANEOUS) ×2
EXTRT SYSTEM ALEXIS 17CM (MISCELLANEOUS)
GAUZE SPONGE 4X4 12PLY STRL (GAUZE/BANDAGES/DRESSINGS) ×2 IMPLANT
GLOVE SURG ENC MOIS LTX SZ6.5 (GLOVE) ×2 IMPLANT
GLOVE SURG UNDER LTX SZ7 (GLOVE) ×2 IMPLANT
GOWN STRL REUS W/ TWL LRG LVL3 (GOWN DISPOSABLE) ×2 IMPLANT
GOWN STRL REUS W/TWL LRG LVL3 (GOWN DISPOSABLE) ×2
KIT TURNOVER KIT A (KITS) ×2 IMPLANT
MANIFOLD NEPTUNE II (INSTRUMENTS) ×2 IMPLANT
MAT PREVALON FULL STRYKER (MISCELLANEOUS) ×2 IMPLANT
NS IRRIG 1000ML POUR BTL (IV SOLUTION) ×2 IMPLANT
PACK C SECTION AR (MISCELLANEOUS) ×2 IMPLANT
PAD OB MATERNITY 4.3X12.25 (PERSONAL CARE ITEMS) ×2 IMPLANT
PAD PREP 24X41 OB/GYN DISP (PERSONAL CARE ITEMS) ×2 IMPLANT
PENCIL SMOKE EVACUATOR (MISCELLANEOUS) ×2 IMPLANT
SPONGE GAUZE 4X4 12PLY (GAUZE/BANDAGES/DRESSINGS) ×10 IMPLANT
SUT MNCRL AB 4-0 PS2 18 (SUTURE) ×2 IMPLANT
SUT PLAIN 2 0 XLH (SUTURE) IMPLANT
SUT VIC AB 0 CT1 36 (SUTURE) ×8 IMPLANT
SUT VIC AB 3-0 SH 27 (SUTURE) ×1
SUT VIC AB 3-0 SH 27X BRD (SUTURE) ×1 IMPLANT
SYSTEM CONTND EXTRCTN KII BLLN (MISCELLANEOUS) IMPLANT
TAPE MEDIFIX FOAM 3 (GAUZE/BANDAGES/DRESSINGS) ×2 IMPLANT
TAPE STRIPS DRAPE STRL (GAUZE/BANDAGES/DRESSINGS) ×20 IMPLANT

## 2021-02-26 NOTE — Progress Notes (Signed)
Patient ID: Angelica Mason, female   DOB: 1996-09-10, 25 y.o.   MRN: [id number]  Angelica Mason is a 25 y.o. G3P0020 at [redacted]w[redacted]d by LMP admitted for labor management  Subjective:  Patient notes pain relief since epidural placement. FOB at bedside for continuous labor support.   Denies difficulty breathing or respiratory distress, chest pain, abdominal pain, and leg pain.   Objective:  Temp:  [97.6 F (36.4 C)-99 F (37.2 C)] 99 F (37.2 C) (03/22 0251) Pulse Rate:  [73-106] 88 (03/22 0251) Resp:  [18] 18 (03/21 1907) BP: (69-146)/(46-85) 113/65 (03/22 0251) SpO2:  [96 %-100 %] 100 % (03/22 0315) Weight:  [94.5 kg-95 kg] 95 kg (03/21 1530)  Fetal Wellbeing:  Category II  UC:   irregular, every four (4) to five (5) minutes; soft resting tone, pitocin infusing at 3 mu/min  SVE:   Dilation: 6 Effacement (%): 90 Station: -2 Exam by:: Anastasia Pall RN  Labs: Lab Results  Component Value Date   WBC 12.6 (H) 02/25/2021   HGB 15.1 (H) 02/25/2021   HCT 44.8 02/25/2021   MCV 88.2 02/25/2021   PLT 136 (L) 02/25/2021    Assessment:  Angelica Gittens Donaldsonis a 25 y.o.G3P0020 at [redacted]w[redacted]d being admitted for labor management, Rh positive, GBS positive  FHR Category II  Plan:  Discussed fetal heart rate tracing with patient and need for closer monitoring of contractions, consent obtained for placement of IUPC.   IUPC placed without difficulty, will start amnioinfusion if variable decelerations persist.   Patient turned to left side and peanut ball placed between ankles.   Reviewed red flag symptoms and when to call.   Continue orders as written. Reassess as needed.   Update given to Dr. Valentino Saxon. Evaluation of fetal heart rate tracing requested.    Serafina Royals, CNM [facility], [facility] 02/26/2021, 3:31 AM

## 2021-02-26 NOTE — Anesthesia Procedure Notes (Signed)
Epidural Patient location during procedure: OB  Staffing Anesthesiologist: Piscitello, Cleda Mccreedy, MD Performed: anesthesiologist   Preanesthetic Checklist Completed: patient identified, IV checked, site marked, risks and benefits discussed, surgical consent, monitors and equipment checked, pre-op evaluation and timeout performed  Epidural Patient position: sitting Prep: ChloraPrep Patient monitoring: heart rate, continuous pulse ox and blood pressure Approach: midline Location: L4-L5 Injection technique: LOR saline  Needle:  Needle type: Tuohy  Needle gauge: 17 G Needle length: 9 cm and 9 Needle insertion depth: 8 cm Catheter type: closed end flexible Catheter size: 19 Gauge Catheter at skin depth: 13 cm Test dose: negative and 1.5% lidocaine with Epi 1:200 K  Assessment Sensory level: T10 Events: blood not aspirated, injection not painful, no injection resistance, no paresthesia and negative IV test  Additional Notes 1st ttempt Pt. Evaluated and documentation done after procedure finished. Patient identified. Risks/Benefits/Options discussed with patient including but not limited to bleeding, infection, nerve damage, paralysis, failed block, incomplete pain control, headache, blood pressure changes, nausea, vomiting, reactions to medication both or allergic, itching and postpartum back pain. Confirmed with bedside nurse the patient's most recent platelet count. Confirmed with patient that they are not currently taking any anticoagulation, have any bleeding history or any family history of bleeding disorders. Patient expressed understanding and wished to proceed. All questions were answered. Sterile technique was used throughout the entire procedure. Please see nursing notes for vital signs. Test dose was given through epidural catheter and negative prior to continuing to dose epidural or start infusion. Warning signs of high block given to the patient including shortness of breath,  tingling/numbness in hands, complete motor block, or any concerning symptoms with instructions to call for help. Patient was given instructions on fall risk and not to get out of bed. All questions and concerns addressed with instructions to call with any issues or inadequate analgesia.   Patient tolerated the insertion well without immediate complications.Reason for block:procedure for pain

## 2021-02-26 NOTE — Progress Notes (Addendum)
Patient ID: SAPPHIRE TYGART, female   DOB: 06-03-1996, 25 y.o.   MRN: [id number]  In room to see patient, discussed fetal heart rate tracing and inability to continue with labor augmentation at this time.   Amnioinfusion without good return noted.   Recommendation for cesarean section, but options given for watchful waiting or pitocin titration in the setting of Category I tracing.   Patient wishes to consider options with family members at this time. Will notify CNM of decision.    Serafina Royals, CNM [facility], [facility] 02/26/21 4:07 AM

## 2021-02-26 NOTE — Progress Notes (Signed)
CNM notified of recurrent late/variable decelerations despite IV fluid bolus and position changes. Additionally notified of difficulty tracing fetal heart tones during decelerations. CNM at bedside to place IUPC and review strip. CNM instructed RN to continue to change position, amnioinfuse if recurrent variable persist, change position with peanut ball as needed, and titrate pit until MVU's are adequate.

## 2021-02-26 NOTE — Progress Notes (Signed)
Patient ID: Angelica Mason, female   DOB: December 15, 1995, 25 y.o.   MRN: [id number]  Patient agrees to cesarean section at this time. Verbal and written consent obtained.   Possible risks of cesarean section include, but are not limited to injury to bowel, urinary tract, nerves, and/or pelvic floor; bleeding, infection and fetal laceration.   Dr. Valentino Saxon notified of decision.    Serafina Royals, CNM [facility], [facility] 02/26/21 4:55 AM

## 2021-02-26 NOTE — Op Note (Addendum)
Cesarean Section Procedure Note  Indications: non-reassuring fetal status  Pre-operative Diagnosis: 40 week 0 day pregnancy, fetal intolerance to labor.  Post-operative Diagnosis: Same  Surgeon: Hildred Laser, MD  Assistants:   Serafina Royals, CNM.  An experienced assistant was required given the standard of surgical care given the complexity of the case.  This assistant was needed for exposure, dissection, suctioning, retraction, instrument exchange, and for overall help during the procedure.  Procedure: Primary low transverse Cesarean Section  Anesthesia: Spinal anesthesia  Procedure Details: The patient was seen in the Holding Room. The risks, benefits, complications, treatment options, and expected outcomes were discussed with the patient.  The patient concurred with the proposed plan, giving informed consent.  The site of surgery properly noted/marked. The patient was taken to the Operating Room, identified as Angelica Mason and the procedure verified as C-Section Delivery.   After dosing of epidural anestheisa, the patient was draped and prepped in the usual sterile manner. A Time Out was held and the above information confirmed. Anesthesia was tested and noted to be adequate. A Pfannenstiel incision was made and carried down through the subcutaneous tissue to the fascia. Fascial incision was made and extended transversely. The fascia was separated from the underlying rectus tissue superiorly and inferiorly. The peritoneum was identified and entered. Peritoneal incision was extended longitudinally. The surgical assist was able to provide retraction to allow for clear visualization of surgical site. The utero-vesical peritoneal reflection was unable to be separated from the lower uterine segment (due to extremely thin layer), so no bladder flap was created. A low transverse uterine incision was made. Delivered from cephalic, occiput posterior presentation was a 3310 gram female with Apgar  scores of 8 at one minute and 9 at five minutes. Loose nuchal cord x 1 was reduced after delivery of fetal head.  The assistant was able to apply adequate fundal pressure to allow for successful delivery of the fetus. After the umbilical cord was clamped and cut cord blood was obtained for evaluation. The placenta was removed intact and appeared normal. The uterus was exteriorized and cleared of all clots and debris. The uterine outline, tubes and ovaries appeared normal.  The uterine incision was closed with running locked sutures of 0-Vicryl.  A second suture of 0-Vicryl was used in an imbricating layer. Hemostasis was observed. The uterus was replaced into the abdomen.The pericolic gutters were then cleared of all clots and debris. The fascia was then grasped with Kochers, and 60 ml of 1.3% Exparel solution (diluted with normal saline) was injected into the fascial layer. The fascia was then reapproximated with a running suture of 0-Vicryl. The subcutaneous fat layer was reapproximated with 2-0 Vicryl. The skin was reapproximated with 4-0 Monocryl and injected with 30 ml of the Exparel solution. Steri-strips were placed over the incision.  Instrument, sponge, and needle counts were correct prior the abdominal closure and at the conclusion of the case.   Findings: Female infant, cephalic presentation )(occiput posterior position), 3310 grams, with Apgar scores of 8 at one minute and 9 at five minutes. Intact placenta with 3 vessel cord.  Loose nuchal cord x 1, reducible after delivery of fetal head.  Clear amniotic fluid at amniotomy (from amnioinfusion) The uterine outline, tubes and ovaries appeared normal.   Estimated Blood Loss:  275 ml (QBL)      Drains: foley catheter to gravity drainage, 250 ml of clear urine at end of the procedure         Total  IV Fluids:  800 ml  Specimens: None         Implants: None         Complications:  None; patient tolerated the procedure well.          Disposition: PACU - hemodynamically stable.         Condition: stable   Hildred Laser, MD [facility]

## 2021-02-26 NOTE — Progress Notes (Signed)
Called with by Serafina Royals, CNM due to persistent Category II tracing (variables and late decelerations).  Unable to raise pitocin beyond 6 mIU.  Unable to resolve with position changes, fluid bolus. Attempted amnioinfusion. Has epidural in place.  The risks of surgery were discussed with the patient including but were not limited to: bleeding which may require transfusion or reoperation; infection which may require antibiotics; injury to bowel, bladder, ureters or other surrounding organs; injury to the fetus; need for additional procedures including hysterectomy in the event of a life-threatening hemorrhage; formation of adhesions; placental abnormalities with subsequent pregnancies; incisional problems; thromboembolic phenomenon and other postoperative/anesthesia complications.  The patient concurred with the proposed plan, giving informed written consent for the procedure.   Patient has been NPO since 5 pm yesterday, she will remain NPO for procedure. Anesthesia and OR aware. Preoperative prophylactic antibiotics and SCDs ordered on call to the OR.  To OR when ready.   Hildred Laser, MD [facility]

## 2021-02-26 NOTE — Lactation Note (Signed)
This note was copied from a baby's chart. Lactation Consultation Note  Patient Name: Angelica Mason Date: 02/26/2021 Reason for consult: Initial assessment;1st time breastfeeding Age:27 hours  Lactation to the room for initial visit. Mother is holding the baby, swaddled in football on the left. Encouraged feeding on demand and with cues. If baby is not cueing encouraged hand expression and skin to skin. Taught proper technique for hand expression. After unwrapping him, he began to cue, Mother was taught how to sandwich the breast in the same direction as his smile, stoke the nipple nose to chin and wait for a wide gape. Initially Mother was tender to his latch but after after several attempts she no longer stated it was tender. Encouraged 8 or more attempts in the first 24 hours and 8 or more good feeds after 24 HOL. Discussed HE/spoon feeding if needed to wake him for a feed.  Reviewed appropriate diapers for days of life and How to know your baby is getting enough to eat. Reviewed "Understanding Postpartum and Newborn Care" INJOY booklet at bedside. Mother has wireless pumps, discussed flange size may be too small and encouraged her to look up the next size, 27 or 28 mm for her pump.[facility] # left on board, encouraged to call for any assistance. Mother has no further questions at this time.    Maternal Data Has patient been taught Hand Expression?: Yes Does the patient have breastfeeding experience prior to this delivery?: No  Feeding Mother's Current Feeding Choice: Breast Milk  LATCH Score Latch: Repeated attempts needed to sustain latch, nipple held in mouth throughout feeding, stimulation needed to elicit sucking reflex.  Audible Swallowing: A few with stimulation  Type of Nipple: Everted at rest and after stimulation (short/flat, require stim)  Comfort (Breast/Nipple): Soft / non-tender  Hold (Positioning): Assistance needed to correctly position infant at breast and maintain  latch.  LATCH Score: 7   Lactation Tools Discussed/Used    Interventions Interventions: Breast feeding basics reviewed;Assisted with latch;Hand express;Support pillows;Adjust position;Position options;Expressed milk;Education  Discharge Pump: Personal;DEBP (has wireless Tsrete pumps)  Consult Status Consult Status: Follow-up Date: 02/27/21 Follow-up type: Call as needed    Anabella Capshaw D Ladell Bey 02/26/2021, 1:47 PM

## 2021-02-26 NOTE — Transfer of Care (Signed)
Immediate Anesthesia Transfer of Care Note  Patient: Angelica Mason  Procedure(s) Performed: CESAREAN SECTION  Patient Location: PACU  Anesthesia Type:Epidural  Level of Consciousness: awake, alert , oriented and patient cooperative  Airway & Oxygen Therapy: Patient Spontanous Breathing  Post-op Assessment: Report given to RN and Post -op Vital signs reviewed and stable  Post vital signs: Reviewed and stable  Last Vitals:  Vitals Value Taken Time  BP 138/83 02/26/21 0707  Temp    Pulse 100 02/26/21 0713  Resp 19 02/26/21 0713  SpO2 100 % 02/26/21 0713  Vitals shown include unvalidated device data.  Last Pain:  Vitals:   02/26/21 0345  TempSrc: Oral  PainSc:          Complications: No complications documented.

## 2021-02-27 LAB — CBC
HCT: 37.4 % (ref 36.0–46.0)
Hemoglobin: 12.7 g/dL (ref 12.0–15.0)
MCH: 30.5 pg (ref 26.0–34.0)
MCHC: 34 g/dL (ref 30.0–36.0)
MCV: 89.7 fL (ref 80.0–100.0)
Platelets: 132 10*3/uL — ABNORMAL LOW (ref 150–400)
RBC: 4.17 MIL/uL (ref 3.87–5.11)
RDW: 14.7 % (ref 11.5–15.5)
WBC: 11.7 10*3/uL — ABNORMAL HIGH (ref 4.0–10.5)
nRBC: 0 % (ref 0.0–0.2)

## 2021-02-27 LAB — MEASLES/MUMPS/RUBELLA IMMUNITY
Mumps IgG: 187 AU/mL (ref >10.9)
Rubella: 18.1 index (ref >0.99)
Rubeola IgG: >300 AU/mL (ref >16.4)

## 2021-02-27 LAB — VARICELLA ZOSTER ANTIBODY, IGG: Varicella IgG: 759 index (ref >165)

## 2021-02-27 MED ORDER — ACETAMINOPHEN 500 MG PO TABS
1000.0000 mg | ORAL_TABLET | Freq: Four times a day (QID) | ORAL | Status: DC
  Administered 2021-02-27 – 2021-02-28 (×6): 1000 mg via ORAL
  Filled 2021-02-27 (×6): qty 2

## 2021-02-27 NOTE — Lactation Note (Signed)
This note was copied from a baby's chart. Lactation Consultation Note  Patient Name: Angelica Mason [id number] Date: 02/27/2021 Reason for consult: Follow-up assessment;1st time breastfeeding;Term;Other (Comment) (c-section) Age:27 hours  Lactation follow-up. Parents report a restless night with baby up, everyone sleepy this morning. Baby has been resting since 24hr screens this morning. Mom is feeling that feedings are getting better and mom is more confident in positioning, hand expression, and sandwiching of her breast tissue.  LC praised mom for breastfeeding her baby, continuing to work/improve breastfeeding skills, and seeking assistance as needed. Reviewed typical newborn behaviors for day 2 of life, output expectations, signs of adequate transfer/how to tell that baby is getting enough. Encouraged on demand feedings, hand expression/spoon feeding if needed, and calling for assistance. Whiteboard updated with LC name/number. Encouraged to call for feeding observation at next attempt.  Maternal Data Has patient been taught Hand Expression?: Yes Does the patient have breastfeeding experience prior to this delivery?: No  Feeding Mother's Current Feeding Choice: Breast Milk  LATCH Score Latch:  (baby sleeping)                  Lactation Tools Discussed/Used    Interventions Interventions: Breast feeding basics reviewed;Education  Discharge    Consult Status Consult Status: Follow-up Date: 02/27/21 Follow-up type: In-patient    Danford Bad 02/27/2021, 1:19 PM

## 2021-02-27 NOTE — Anesthesia Postprocedure Evaluation (Signed)
Anesthesia Post Note  Patient: Angelica Mason  Procedure(s) Performed: CESAREAN SECTION  Patient location during evaluation: Mother Baby Anesthesia Type: Epidural Level of consciousness: awake, oriented and awake and alert Pain management: pain level controlled Vital Signs Assessment: post-procedure vital signs reviewed and stable Respiratory status: spontaneous breathing, respiratory function stable and nonlabored ventilation Cardiovascular status: blood pressure returned to baseline and stable Postop Assessment: no headache and no backache Anesthetic complications: no   No complications documented.   Last Vitals:  Vitals:   02/26/21 2309 02/27/21 0700  BP: 127/83 131/86  Pulse: 93 86  Resp: 20 18  Temp: 36.6 C   SpO2: 96% 97%    Last Pain:  Vitals:   02/27/21 0700  TempSrc:   PainSc: 9                  [employer]

## 2021-02-27 NOTE — Progress Notes (Signed)
Postpartum Day # 1: Cesarean Delivery  Subjective: Patient reports tolerating PO, + flatus and no problems voiding.  Pain is controlled with pain medications.   Objective: Vital signs in last 24 hours: Temp:  [97.8 F (36.6 C)-98.9 F (37.2 C)] 97.8 F (36.6 C) (03/22 2309) Pulse Rate:  [89-112] 93 (03/22 2309) Resp:  [13-31] 20 (03/22 2309) BP: (114-140)/(64-85) 127/83 (03/22 2309) SpO2:  [94 %-100 %] 96 % (03/22 2309)  Physical Exam:  General: alert and no distress Lungs: clear to auscultation bilaterally Breasts: normal appearance, no masses or tenderness Heart: regular rate and rhythm, S1, S2 normal, no murmur, click, rub or gallop Abdomen: soft, non-tender; bowel sounds normal; no masses,  no organomegaly Pelvis: Lochia appropriate, Uterine Fundus firm, Incision: bandage clean/dry/intact Extremities: DVT Evaluation: No evidence of DVT seen on physical exam. Negative Homan's sign. No cords or calf tenderness. No significant calf/ankle edema.  Recent Labs    02/25/21 1519 02/27/21 0639  HGB 15.1* 12.7  HCT 44.8 37.4    Assessment/Plan: Status post Cesarean section. Doing well postoperatively.  Regular diet as tolerated Continue PO pain management Breastfeeding, lactation consult as needed Encourage ambulation Contraception: undecided. Can discuss further at postpartum visit.  Desires circumcision for female infant.  Continue current care. Plan for discharge tomorrow.  Hildred Laser, MD [facility]

## 2021-02-27 NOTE — Lactation Note (Signed)
This note was copied from a baby's chart. Lactation Consultation Note  Patient Name: Angelica Mason [id number] Date: 02/27/2021 Reason for consult: Follow-up assessment;Mother's request;1st time breastfeeding;Term;Other (Comment) (c-section) Age:27 hours  Lactation in room to observe feeding attempt. Baby calm, already in football hold at left breast. Mom did self hand expression to help harden nipple and encourage baby.   Baby calmly accepted breast, came on/off, with round nipple pointed out to mom to show deep latch and then baby was able to sustain latch with strong rhythmic sucking pattern, light swallows, noted and pointed out to mom.   Encouraged mom to continue with on demand feedings, hand expression prior to latch, and continue with sandwiching for deep latch.  Mom experiencing cramps with feeding, given reassurance of good latch and transfer, and encouraged continued breast compression and massage to ensure full feeding.  Encouraged to call with questions/concerns, or for BF support.  Maternal Data Has patient been taught Hand Expression?: Yes Does the patient have breastfeeding experience prior to this delivery?: No  Feeding Mother's Current Feeding Choice: Breast Milk  LATCH Score Latch: Grasps breast easily, tongue down, lips flanged, rhythmical sucking.  Audible Swallowing: A few with stimulation  Type of Nipple: Everted at rest and after stimulation  Comfort (Breast/Nipple): Soft / non-tender  Hold (Positioning): No assistance needed to correctly position infant at breast.  LATCH Score: 9   Lactation Tools Discussed/Used    Interventions Interventions: Breast feeding basics reviewed;Education (observed feeding)  Discharge    Consult Status Consult Status: PRN Date: 02/27/21 Follow-up type: Call as needed    Danford Bad 02/27/2021, 2:18 PM

## 2021-02-27 NOTE — Anesthesia Postprocedure Evaluation (Signed)
Anesthesia Post Note  Patient: Angelica Mason  Procedure(s) Performed: CESAREAN SECTION  Patient location during evaluation: Mother Baby Anesthesia Type: Epidural Anesthetic complications: no   No complications documented.   Last Vitals:  Vitals:   02/26/21 2309 02/27/21 0700  BP: 127/83 131/86  Pulse: 93 86  Resp: 20 18  Temp: 36.6 C   SpO2: 96% 97%    Last Pain:  Vitals:   02/27/21 0700  TempSrc:   PainSc: 9                  [employer]

## 2021-02-28 ENCOUNTER — Other Ambulatory Visit: Payer: Medicaid Other

## 2021-02-28 ENCOUNTER — Encounter: Payer: Medicaid Other | Admitting: Certified Nurse Midwife

## 2021-02-28 DIAGNOSIS — Z3403 Encounter for supervision of normal first pregnancy, third trimester: Secondary | ICD-10-CM

## 2021-02-28 DIAGNOSIS — Z3A40 40 weeks gestation of pregnancy: Secondary | ICD-10-CM

## 2021-02-28 MED ORDER — OXYCODONE HCL 5 MG PO TABS: 5.0000 mg | ORAL_TABLET | ORAL | 0 refills | Status: DC | PRN

## 2021-02-28 MED ORDER — IBUPROFEN 800 MG PO TABS: 800.0000 mg | ORAL_TABLET | Freq: Three times a day (TID) | ORAL | 0 refills | Status: AC

## 2021-02-28 MED ORDER — FERROUS SULFATE 325 (65 FE) MG PO TABS: 325.0000 mg | ORAL_TABLET | Freq: Two times a day (BID) | ORAL | 3 refills | Status: AC

## 2021-02-28 NOTE — Lactation Note (Signed)
This note was copied from a baby's chart. Lactation Consultation Note  Patient Name: Angelica Mason [id number] Date: 02/28/2021   Age:27 hours  Attempted lactation visit; family sleeping soundly.   Danford Bad 02/28/2021, 9:00 AM

## 2021-02-28 NOTE — Discharge Instructions (Signed)
Please call your doctor or return to the ER if you experience any chest pains, shortness of breath, dizziness, visual changes, severe headache (unrelieved by pain meds), fever greater than 101, any heavy bleeding (saturating more than 1 pad per hour), large clots, or foul smelling discharge, any worsening abdominal pain and cramping that is not controlled by pain medication, any calf/leg pain or redness, any breast concerns (redness/pain), or any signs of postpartum depression. No tampons, enemas, douches, or sexual intercourse for 6 weeks. Also avoid tub baths, hot tubs, or swimming for 6 weeks.   Check your incision daily for any signs of infection such as redness, warmth, swelling, increased pain, our pus/foul smelling drainage   Activity: do not lift over 10-15 lbs for 6 weeks  No driving for 1-2 weeks Pelvic rest for 6 weeks  Showers only for 6 weeks (no baths)

## 2021-02-28 NOTE — Discharge Summary (Signed)
Physician Obstetric Discharge Summary  Patient Name: Angelica Mason DOB: [birth year] MRN: [id number]                            Discharge Summary  Date of Admission: 02/25/2021 Date of Discharge: 02/28/2021 Delivering Provider: Hildred Laser   Admitting Diagnosis: Normal labor [O80, Z37.9] at [redacted]w[redacted]d Secondary diagnosis:  Active Problems:   Normal labor   Cesarean delivery delivered   [redacted] weeks gestation of pregnancy   Mode of Delivery:       low uterine, transverse     Discharge diagnosis: Term Pregnancy Delivered      Intrapartum Procedures: epidural and GBS prophylaxis   Post partum procedures: none  Complications: none                     Discharge Day SOAP Note:  Subjective:  The patient has no complaints.  She is ambulating well. She is taking PO well. Pain is well controlled with current medications. Patient is urinating without difficulty.   She is passing flatus.    Objective  Vital signs: BP 119/78 (BP Location: Right Arm)   Pulse 90   Temp 98.7 F (37.1 C) (Oral)   Resp 18   Ht 5\' 3"  (1.6 m)   Wt 95 kg Comment: Corrected Wt, wrong pt  LMP 05/22/2020   SpO2 98%   Breastfeeding Unknown   BMI 37.10 kg/m   Physical Exam: Gen: NAD Abdomen:  clean, dry, no drainage Fundus Fundal Tone: Firm  Lochia Amount: Small     Data Review Labs: Lab Results  Component Value Date   WBC 11.7 (H) 02/27/2021   HGB 12.7 02/27/2021   HCT 37.4 02/27/2021   MCV 89.7 02/27/2021   PLT 132 (L) 02/27/2021   CBC Latest Ref Rng & Units 02/27/2021 02/25/2021 09/25/2020  WBC 4.0 - 10.5 K/uL 11.7(H) 12.6(H) 8.4  Hemoglobin 12.0 - 15.0 g/dL 09/27/2020 15.1(H) 13.2  Hematocrit 36.0 - 46.0 % 37.4 44.8 38.2  Platelets 150 - 400 K/uL 132(L) 136(L) 194   A POS  Edinburgh Score: Edinburgh Postnatal Depression Scale Screening Tool 02/26/2021  I have been able to laugh and see the funny side of things. 0  I have looked forward with enjoyment to things. 0  I have blamed myself  unnecessarily when things went wrong. 0  I have been anxious or worried for no good reason. 0  I have felt scared or panicky for no good reason. 0  Things have been getting on top of me. 0  I have been so unhappy that I have had difficulty sleeping. 0  I have felt sad or miserable. 0  I have been so unhappy that I have been crying. 0  The thought of harming myself has occurred to me. 0  Edinburgh Postnatal Depression Scale Total 0    Assessment:  Active Problems:   Normal labor   Cesarean delivery delivered   [redacted] weeks gestation of pregnancy   Doing well.  Normal progress as expected.  Plan:  Discharge to home  Modified rest as directed - may slowly resume normal activities with restrictions  as discussed.  Medications as written.  See below for additional.      Discharge Instructions: Per After Visit Summary. Activity: Advance as tolerated. Pelvic rest for 6 weeks.  Also refer to After Visit Summary.  Wound care discussed. Diet: Regular Medications: Allergies as of 02/28/2021  Reactions   Toradol [ketorolac Tromethamine] Itching      Medication List    STOP taking these medications   metoCLOPramide 10 MG tablet Commonly known as: REGLAN   promethazine 12.5 MG tablet Commonly known as: PHENERGAN     TAKE these medications   acetaminophen 325 MG tablet Commonly known as: TYLENOL Take 650 mg by mouth every 6 (six) hours as needed.   ferrous sulfate 325 (65 FE) MG tablet Take 1 tablet (325 mg total) by mouth 2 (two) times daily with a meal.   ibuprofen 800 MG tablet Commonly known as: ADVIL Take 1 tablet (800 mg total) by mouth every 8 (eight) hours.   oxyCODONE 5 MG immediate release tablet Commonly known as: Oxy IR/ROXICODONE Take 1 tablet (5 mg total) by mouth every 4 (four) hours as needed for moderate pain.   prenatal multivitamin Tabs tablet Take 1 tablet by mouth daily at 12 noon.   zolpidem 5 MG tablet Commonly known as: AMBIEN Take 1 tablet (5  mg total) by mouth once for 1 dose.      Outpatient follow up:  Postpartum contraception: Will discuss at first post-partum visit.  Discharged Condition: good  Discharged to: home  Newborn Data: Disposition:home with mother  Apgars: APGAR (1 MIN): 8   APGAR (5 MINS): 9   APGAR (10 MINS):    Baby Feeding: Breast   Doreene Burke, CNM  02/28/2021

## 2021-02-28 NOTE — Progress Notes (Signed)
Discharge order received from doctor. Reviewed discharge instructions and prescriptions with patient and answered all questions. Follow up appointment instructions given. Patient verbalized understanding. ID bands checked. Patient discharged home with infant via wheelchair by nursing/auxillary.    Sherissa Tenenbaum, RN  

## 2021-02-28 NOTE — Final Progress Note (Signed)
Discharge Day SOAP Note:  Subjective:  The patient has no complaints.  She is ambulating well. She is taking PO well. Pain is well controlled with current medications. Patient is urinating without difficulty.   She is passing flatus.    Objective  Vital signs: BP 119/78 (BP Location: Right Arm)   Pulse 90   Temp 98.7 F (37.1 C) (Oral)   Resp 18   Ht 5\' 3"  (1.6 m)   Wt 95 kg Comment: Corrected Wt, wrong pt  LMP 05/22/2020   SpO2 98%   Breastfeeding Unknown   BMI 37.10 kg/m   Physical Exam: Gen: NAD Abdomen:  clean, dry, no drainage Fundus Fundal Tone: Firm  Lochia Amount: Small     Data Review Labs: Lab Results  Component Value Date   WBC 11.7 (H) 02/27/2021   HGB 12.7 02/27/2021   HCT 37.4 02/27/2021   MCV 89.7 02/27/2021   PLT 132 (L) 02/27/2021   CBC Latest Ref Rng & Units 02/27/2021 02/25/2021 09/25/2020  WBC 4.0 - 10.5 K/uL 11.7(H) 12.6(H) 8.4  Hemoglobin 12.0 - 15.0 g/dL 09/27/2020 15.1(H) 13.2  Hematocrit 36.0 - 46.0 % 37.4 44.8 38.2  Platelets 150 - 400 K/uL 132(L) 136(L) 194   A POS  Edinburgh Score: Edinburgh Postnatal Depression Scale Screening Tool 02/26/2021  I have been able to laugh and see the funny side of things. 0  I have looked forward with enjoyment to things. 0  I have blamed myself unnecessarily when things went wrong. 0  I have been anxious or worried for no good reason. 0  I have felt scared or panicky for no good reason. 0  Things have been getting on top of me. 0  I have been so unhappy that I have had difficulty sleeping. 0  I have felt sad or miserable. 0  I have been so unhappy that I have been crying. 0  The thought of harming myself has occurred to me. 0  Edinburgh Postnatal Depression Scale Total 0    Assessment:  Active Problems:   Normal labor   Cesarean delivery delivered   [redacted] weeks gestation of pregnancy   Doing well.  Normal progress as expected.  Plan:  Discharge to home  Modified rest as directed - may slowly  resume normal activities with restrictions  as discussed.  Medications as written.  See below for additional.      Discharge Instructions: Per After Visit Summary. Activity: Advance as tolerated. Pelvic rest for 6 weeks.  Also refer to After Visit Summary.  Wound care discussed. Diet: Regular Medications: Allergies as of 02/28/2021      Reactions   Toradol [ketorolac Tromethamine] Itching      Medication List    STOP taking these medications   metoCLOPramide 10 MG tablet Commonly known as: REGLAN   promethazine 12.5 MG tablet Commonly known as: PHENERGAN     TAKE these medications   acetaminophen 325 MG tablet Commonly known as: TYLENOL Take 650 mg by mouth every 6 (six) hours as needed.   ferrous sulfate 325 (65 FE) MG tablet Take 1 tablet (325 mg total) by mouth 2 (two) times daily with a meal.   ibuprofen 800 MG tablet Commonly known as: ADVIL Take 1 tablet (800 mg total) by mouth every 8 (eight) hours.   oxyCODONE 5 MG immediate release tablet Commonly known as: Oxy IR/ROXICODONE Take 1 tablet (5 mg total) by mouth every 4 (four) hours as needed for moderate pain.   prenatal multivitamin  Tabs tablet Take 1 tablet by mouth daily at 12 noon.   zolpidem 5 MG tablet Commonly known as: AMBIEN Take 1 tablet (5 mg total) by mouth once for 1 dose.      Outpatient follow up:  Postpartum contraception: Will discuss at first post-partum visit.  Discharged Condition: good  Discharged to: home  Newborn Data: Disposition:home with mother  Apgars: APGAR (1 MIN): 8   APGAR (5 MINS): 9   APGAR (10 MINS):    Baby Feeding: Breast   Doreene Burke, CNM  02/28/2021

## 2021-03-01 ENCOUNTER — Telehealth: Payer: Self-pay | Admitting: *Deleted

## 2021-03-01 NOTE — Telephone Encounter (Signed)
Transition Care Management Follow-up Telephone Call  Date of discharge and from where: 02/28/2021 [facility] [place]   How have you been since you were released from the hospital? "Doing good"  Any questions or concerns? No  Items Reviewed:  Did the pt receive and understand the discharge instructions provided? Yes   Medications obtained and verified? Yes   Other? No   Any new allergies since your discharge? No   Dietary orders reviewed? No  Do you have support at home? Yes    Functional Questionnaire: (I = Independent and D = Dependent) ADLs: I  Bathing/Dressing- I  Meal Prep- I  Eating- I  Maintaining continence- I  Transferring/Ambulation- I  Managing Meds- I  Follow up appointments reviewed:   PCP Hospital f/u appt confirmed? No    Specialist Hospital f/u appt confirmed? Yes  Scheduled to see OBGYN on 03/04/2021 @ 1400 and 04/15/2021 @ 1330.  Are transportation arrangements needed? No   If their condition worsens, is the pt aware to call PCP or go to the Emergency Dept.? Yes  Was the patient provided with contact information for the PCP's office or ED? Yes  Was to pt encouraged to call back with questions or concerns? Yes

## 2021-03-04 ENCOUNTER — Ambulatory Visit (INDEPENDENT_AMBULATORY_CARE_PROVIDER_SITE_OTHER): Payer: Medicaid Other | Admitting: Certified Nurse Midwife

## 2021-03-04 ENCOUNTER — Other Ambulatory Visit: Payer: Self-pay

## 2021-03-04 ENCOUNTER — Encounter: Payer: Self-pay | Admitting: Certified Nurse Midwife

## 2021-03-04 VITALS — BP 140/80 | HR 86 | Ht 63.0 in | Wt 199.0 lb

## 2021-03-04 DIAGNOSIS — Z789 Other specified health status: Secondary | ICD-10-CM

## 2021-03-04 NOTE — Progress Notes (Signed)
Pt present for incision check. Pt stated that she was doing well and denies any issues at this time with her incision area.

## 2021-03-04 NOTE — Progress Notes (Signed)
I have seen, interviewed, and examined the patient in conjunction with the [employer] and affirm the diagnosis and management plan.   Gunnar Bulla, CNM [facility], [facility] 03/04/21 4:08 PM

## 2021-03-04 NOTE — Progress Notes (Signed)
    OBSTETRICS/GYNECOLOGY POST-OPERATIVE CLINIC VISIT  Subjective:     Angelica Mason is a 25 y.o. female who presents to the clinic 1 weeks status post primary cesarean section for non-reassuring fetal heart tones remote for delivery with single fetal nuchal cord and persistent occiput posterior malpositioning.   Eating a regular diet without difficulty. Bowel movements are normal.   Pain is controlled with current analgesics. Medications being used: acetaminophen and ibuprofen (OTC).  Denies difficulty breathing, respiratory distress, chest pain, excessive vaginal bleeding, and leg pain or swelling.  Review of Systems  Pertinent items are noted in HPI.   Objective:    BP 140/80   Pulse 86   Ht 5\' 3"  (1.6 m)   Wt 90.3 kg   Breastfeeding Yes   BMI 35.25 kg/m    General:  alert and no distress  Abdomen: soft, bowel sounds active, non-tender  Incision:   healing well, no drainage, no erythema, no hernia, no seroma, no swelling, well approximated, no dehiscence, incision well approximated    Assessment:   Postpartum care following cesarean delivery  Lactating mother  Presence of surgical incision   Plan:   Continue any current medications rotating tylenol and motrin.  Honeycomb dressing and steri strips removed. Wound care discussed.   Discussed birth control options, patient is going to use condoms for contraception.  Activity restrictions: no gym class and no lifting more than 10 pounds   Anticipated return to work: 8 -12 weeks.   Follow up: 5 weeks for 6-week postpartum visit with JML or sooner if needed.   , Student-Mid[employer]ife [employer] 03/04/21 2:59 PM

## 2021-03-04 NOTE — Patient Instructions (Signed)
Incision Care, Adult An incision is a cut that a doctor makes in your skin for surgery. Most times, these cuts are closed after surgery. Your cut from surgery may be closed with:  Stitches (sutures).  Staples.  Skin glue.  Skin tape (adhesive) strips. You may need to return to your doctor to have stitches or staples taken out. This may happen many days or many weeks after your surgery. You need to take good care of your cut so it does not get infected. Follow instructions from your doctor about how to care for your cut. Supplies needed:  Soap and water.  A clean hand towel.  Wound cleanser.  A clean bandage (dressing), if needed.  Cream or ointment, if told by your doctor.  Clean gauze. How to care for your cut from surgery Cleaning your cut Ask your doctor how to clean your cut. You may need to:  Use mild soap and water, or a wound cleanser.  Use a clean gauze to pat your cut dry after you clean it. Changing your bandage  Wash your hands with soap and water for at least 20 seconds before and after you change your bandage. If you cannot use soap and water, use hand sanitizer.  Change your bandage as told by your doctor.  Leave stitches, staples, skin glue, or skin tape strips in place. They may need to stay in place for 2 weeks or longer. If tape strips get loose and curl up, you may trim the loose edges. Do not remove tape strips completely unless your doctor says it is okay.  Put a cream or ointment on your cut. Do this only as told.  Cover your cut with a clean bandage.  Ask your doctor when you can leave your cut uncovered. Checking for infection Check your cut area every day for signs of infection. Check for:  More redness, swelling, or pain.  More fluid or blood.  Warmth.  Pus or a bad smell.   Follow these instructions at home Medicines  Take over-the-counter and prescription medicines only as told by your doctor.  If you were prescribed an antibiotic  medicine, cream, or ointment, use it as told by your doctor. Do not stop using the antibiotic even if your condition improves. Eating and drinking  Eat foods that have a lot of certain nutrients, such as protein, vitamin A, and vitamin C. These foods help your cut heal. ? Foods rich in protein include meat, fish, eggs, dairy, beans, and nuts. ? Foods rich in vitamin A include carrots and dark green, leafy vegetables. ? Foods rich in vitamin C include citrus fruits, tomatoes, broccoli, and peppers.  Drink enough fluid to keep your pee (urine) pale yellow. General instructions  Do not take baths, swim, use a hot tub, or put your cut underwater until your doctor approves. Ask your doctor if you may take showers. You may only be allowed to take sponge baths.  Limit movement around your cut. This helps with healing. ? Try not to strain, lift, or exercise for the first 2 weeks, or for as long as told by your doctor. ? Return to your normal activities as told by your doctor. Ask your doctor what activities are safe for you.  Do not scratch or pick at your cut. Keep it covered as told by your doctor.  Protect your cut from the sun when you are outside for the first 6 months, or for as long as told by your doctor. Cover up   the scar area or put on sunscreen that has an SPF of at least 30.  Do not use any products that contain nicotine or tobacco, such as cigarettes, e-cigarettes, and chewing tobacco. These can delay cut healing after surgery. If you need help quitting, ask your doctor.  Keep all follow-up visits as told by your doctor. This is important.   Contact a doctor if:  You have any of these signs of infection around your cut: ? More redness, swelling, or pain. ? More fluid or blood. ? Warmth. ? Pus or a bad smell.  You have a fever.  You feel like you may vomit (nauseous).  You vomit.  You are dizzy.  Your stitches, staples, skin glue, or tape strips come undone. Get help  right away if:  Your cut has a red streak coming from it.  Your cut bleeds through your bandage, and bleeding does not stop with gentle pressure.  Your cut opens up and comes apart.  Your body reacts very badly to an infection. This may include: ? A fever, chills, or feeling cold. ? Feeling mixed up, worried, or nervous. ? Very bad pain. ? Trouble breathing. ? A fast heartbeat. ? Clammy or sweaty skin. ? A rash. These symptoms may be an emergency. Do not wait to see if the symptoms will go away. Get medical help right away. Call your local emergency services (911 in the [place] Do not drive yourself to the hospital. Summary  Follow instructions from your doctor about how to care for your cut from surgery.  Wash your hands with soap and water for at least 20 seconds before and after you change your bandage. If you cannot use soap and water, use hand sanitizer.  Check your cut area every day for signs of infection.  Keep all follow-up visits as told by your doctor. This is important. This information is not intended to replace advice given to you by your health care provider. Make sure you discuss any questions you have with your health care provider. Document Revised: 09/14/2019 Document Reviewed: 09/14/2019 Elsevier Patient Education  2021 Elsevier Inc.  

## 2021-03-08 DIAGNOSIS — Z419 Encounter for procedure for purposes other than remedying health state, unspecified: Secondary | ICD-10-CM | POA: Diagnosis not present

## 2021-04-07 DIAGNOSIS — Z419 Encounter for procedure for purposes other than remedying health state, unspecified: Secondary | ICD-10-CM | POA: Diagnosis not present

## 2021-04-15 ENCOUNTER — Other Ambulatory Visit: Payer: Self-pay

## 2021-04-15 ENCOUNTER — Encounter: Payer: Self-pay | Admitting: Certified Nurse Midwife

## 2021-04-15 ENCOUNTER — Ambulatory Visit (INDEPENDENT_AMBULATORY_CARE_PROVIDER_SITE_OTHER): Payer: Medicaid Other | Admitting: Certified Nurse Midwife

## 2021-04-15 DIAGNOSIS — Z98891 History of uterine scar from previous surgery: Secondary | ICD-10-CM

## 2021-04-15 DIAGNOSIS — Z1331 Encounter for screening for depression: Secondary | ICD-10-CM

## 2021-04-15 NOTE — Progress Notes (Signed)
Subjective:    Angelica Mason is a 25 y.o. [id number] African American female who presents for a postpartum visit. She is 7 weeks postpartum following a primary cesarean section, low transverse incision at 40+0 gestational weeks due to non-reassuring fetal heart tones and persistent occiput posterior presentation. Anesthesia: epidural. I have fully reviewed the prenatal and intrapartum course.  Postpartum course has been uncomplicated.   Baby's course has been uncomplicated. Baby is feeding by expressed breastmilk.   Bleeding no bleeding. Bowel function is normal.   Bladder function is normal.   Patient is sexually active. Last sexual activity: at four (4) weeks postpartum. Contraception method is LAM/NFP.   Postpartum depression screening: negative. Score 6.    Last pap 08/2020 and was normal, history of abnormal.  Denies difficulty breathing or respiratory distress, chest pain, abdominal pain, excessive vaginal bleeding, dysuria, and leg pain or swelling.   The following portions of the patient's history were reviewed and updated as appropriate: allergies, current medications, past medical history, past surgical history and problem list.  Review of Systems  Pertinent items are noted in HPI.   Objective:   BP 110/61   Pulse 80   Resp 16   Ht 5\' 3"  (1.6 m)   Wt 193 lb 6.4 oz (87.7 kg)   Breastfeeding Yes   BMI 34.26 kg/m   General:  alert, cooperative and no distress   Breasts:  deferred, no complaints  Lungs: clear to auscultation bilaterally  Heart:  regular rate and rhythm  Abdomen: soft, nontender   Vulva: normal  Vagina: normal vagina  Cervix:  closed  Corpus: Well-involuted  Adnexa:  Non-palpable        Depression screen [facility] 2/9 04/15/2021 02/14/2021 01/31/2021  Decreased Interest 2 0 0  Down, Depressed, Hopeless 1 0 0  PHQ - 2 Score 3 0 0  Altered sleeping 0 - -  Tired, decreased energy 0 - -  Change in appetite 3 - -  Feeling bad or failure about yourself   0 - -  Trouble concentrating 0 - -  Moving slowly or fidgety/restless 0 - -  Suicidal thoughts 0 - -  PHQ-9 Score 6 - -  Difficult doing work/chores Not difficult at all - -   GAD 7 : Generalized Anxiety Score 04/15/2021  Nervous, Anxious, on Edge 1  Control/stop worrying 1  Worry too much - different things 1  Trouble relaxing 0  Restless 0  Easily annoyed or irritable 0  Afraid - awful might happen 0  Total GAD 7 Score 3  Anxiety Difficulty Not difficult at all     Assessment:   Postpartum exam  Seven (7) wks s/p primary cesarean section due to non-reassure fetal heart tones and persistent occiput posterior positioning  Breastfeeding  Depression screening  Contraception counseling   Plan:   Encouraged routine health maintenance techniques.   Reviewed red flag symptoms and when to call.   Follow up in: 4 months for ANNUAL EXAM or earlier if needed.   06/15/2021, CNM [facility], [facility] 04/15/21 10:54 AM

## 2021-04-15 NOTE — Patient Instructions (Signed)
Preventive Care 21-27 Years Old, Female Preventive care refers to lifestyle choices and visits with your health care provider that can promote health and wellness. This includes:  A yearly physical exam. This is also called an annual wellness visit.  Regular dental and eye exams.  Immunizations.  Screening for certain conditions.  Healthy lifestyle choices, such as: ? Eating a healthy diet. ? Getting regular exercise. ? Not using drugs or products that contain nicotine and tobacco. ? Limiting alcohol use. What can I expect for my preventive care visit? Physical exam Your health care provider may check your:  Height and weight. These may be used to calculate your BMI (body mass index). BMI is a measurement that tells if you are at a healthy weight.  Heart rate and blood pressure.  Body temperature.  Skin for abnormal spots. Counseling Your health care provider may ask you questions about your:  Past medical problems.  Family's medical history.  Alcohol, tobacco, and drug use.  Emotional well-being.  Home life and relationship well-being.  Sexual activity.  Diet, exercise, and sleep habits.  Work and work environment.  Access to firearms.  Method of birth control.  Menstrual cycle.  Pregnancy history. What immunizations do I need? Vaccines are usually given at various ages, according to a schedule. Your health care provider will recommend vaccines for you based on your age, medical history, and lifestyle or other factors, such as travel or where you work.   What tests do I need? Blood tests  Lipid and cholesterol levels. These may be checked every 5 years starting at age 20.  Hepatitis C test.  Hepatitis B test. Screening  Diabetes screening. This is done by checking your blood sugar (glucose) after you have not eaten for a while (fasting).  STD (sexually transmitted disease) testing, if you are at risk.  BRCA-related cancer screening. This may be  done if you have a family history of breast, ovarian, tubal, or peritoneal cancers.  Pelvic exam and Pap test. This may be done every 3 years starting at age 21. Starting at age 30, this may be done every 5 years if you have a Pap test in combination with an HPV test. Talk with your health care provider about your test results, treatment options, and if necessary, the need for more tests.   Follow these instructions at home: Eating and drinking  Eat a healthy diet that includes fresh fruits and vegetables, whole grains, lean protein, and low-fat dairy products.  Take vitamin and mineral supplements as recommended by your health care provider.  Do not drink alcohol if: ? Your health care provider tells you not to drink. ? You are pregnant, may be pregnant, or are planning to become pregnant.  If you drink alcohol: ? Limit how much you have to 0-1 drink a day. ? Be aware of how much alcohol is in your drink. In the [place] one drink equals one 12 oz bottle of beer (355 mL), one 5 oz glass of wine (148 mL), or one 1 oz glass of hard liquor (44 mL).   Lifestyle  Take daily care of your teeth and gums. Brush your teeth every morning and night with fluoride toothpaste. Floss one time each day.  Stay active. Exercise for at least 30 minutes 5 or more days each week.  Do not use any products that contain nicotine or tobacco, such as cigarettes, e-cigarettes, and chewing tobacco. If you need help quitting, ask your health care provider.  Do not   use drugs.  If you are sexually active, practice safe sex. Use a condom or other form of protection to prevent STIs (sexually transmitted infections).  If you do not wish to become pregnant, use a form of birth control. If you plan to become pregnant, see your health care provider for a prepregnancy visit.  Find healthy ways to cope with stress, such as: ? Meditation, yoga, or listening to music. ? Journaling. ? Talking to a trusted  person. ? Spending time with friends and family. Safety  Always wear your seat belt while driving or riding in a vehicle.  Do not drive: ? If you have been drinking alcohol. Do not ride with someone who has been drinking. ? When you are tired or distracted. ? While texting.  Wear a helmet and other protective equipment during sports activities.  If you have firearms in your house, make sure you follow all gun safety procedures.  Seek help if you have been physically or sexually abused. What's next?  Go to your health care provider once a year for an annual wellness visit.  Ask your health care provider how often you should have your eyes and teeth checked.  Stay up to date on all vaccines. This information is not intended to replace advice given to you by your health care provider. Make sure you discuss any questions you have with your health care provider. Document Revised: 07/22/2020 Document Reviewed: 08/05/2018 Elsevier Patient Education  2021 Elsevier Inc.  

## 2021-05-08 DIAGNOSIS — Z419 Encounter for procedure for purposes other than remedying health state, unspecified: Secondary | ICD-10-CM | POA: Diagnosis not present

## 2021-06-05 ENCOUNTER — Emergency Department
Admission: EM | Admit: 2021-06-05 | Discharge: 2021-06-05 | Disposition: A | Discharge status: Home / Self Care | Payer: Medicaid Other | Attending: Emergency Medicine | Admitting: Emergency Medicine

## 2021-06-05 ENCOUNTER — Emergency Department: Payer: Medicaid Other

## 2021-06-05 ENCOUNTER — Encounter: Payer: Self-pay | Admitting: Emergency Medicine

## 2021-06-05 ENCOUNTER — Other Ambulatory Visit: Payer: Self-pay

## 2021-06-05 DIAGNOSIS — N3000 Acute cystitis without hematuria: Secondary | ICD-10-CM | POA: Diagnosis not present

## 2021-06-05 DIAGNOSIS — N939 Abnormal uterine and vaginal bleeding, unspecified: Secondary | ICD-10-CM | POA: Insufficient documentation

## 2021-06-05 DIAGNOSIS — Z8616 Personal history of COVID-19: Secondary | ICD-10-CM | POA: Diagnosis not present

## 2021-06-05 DIAGNOSIS — R1031 Right lower quadrant pain: Secondary | ICD-10-CM | POA: Insufficient documentation

## 2021-06-05 LAB — CBC
HCT: 41.2 % (ref 36.0–46.0)
Hemoglobin: 13.9 g/dL (ref 12.0–15.0)
MCH: 29.7 pg (ref 26.0–34.0)
MCHC: 33.7 g/dL (ref 30.0–36.0)
MCV: 88 fL (ref 80.0–100.0)
Platelets: 226 10*3/uL (ref 150–400)
RBC: 4.68 MIL/uL (ref 3.87–5.11)
RDW: 13.4 % (ref 11.5–15.5)
WBC: 7.9 10*3/uL (ref 4.0–10.5)
nRBC: 0 % (ref 0.0–0.2)

## 2021-06-05 LAB — URINALYSIS, COMPLETE (UACMP) WITH MICROSCOPIC
Bacteria, UA: NONE SEEN
Bilirubin Urine: NEGATIVE
Glucose, UA: NEGATIVE mg/dL
Ketones, ur: NEGATIVE mg/dL
Nitrite: NEGATIVE
Protein, ur: NEGATIVE mg/dL
Specific Gravity, Urine: 1.013 (ref 1.005–1.030)
WBC, UA: >50 WBC/hpf — ABNORMAL HIGH (ref 0–5)
pH: 6 (ref 5.0–8.0)

## 2021-06-05 LAB — BASIC METABOLIC PANEL
Anion gap: 11 (ref 5–15)
BUN: 12 mg/dL (ref 6–20)
CO2: 23 mmol/L (ref 22–32)
Calcium: 9.6 mg/dL (ref 8.9–10.3)
Chloride: 106 mmol/L (ref 98–111)
Creatinine, Ser: 0.61 mg/dL (ref 0.44–1.00)
GFR, Estimated: >60 mL/min (ref >60)
Glucose, Bld: 99 mg/dL (ref 70–99)
Potassium: 4.1 mmol/L (ref 3.5–5.1)
Sodium: 140 mmol/L (ref 135–145)

## 2021-06-05 LAB — POC URINE PREG, ED: Preg Test, Ur: NEGATIVE

## 2021-06-05 MED ORDER — CEPHALEXIN 500 MG PO CAPS: 500.0000 mg | ORAL_CAPSULE | Freq: Two times a day (BID) | ORAL | 0 refills | Status: AC

## 2021-06-05 NOTE — ED Triage Notes (Signed)
Pt comes into the ED via POV c/o vaginal bleeding that started 2 days ago.  PT had c-section 3 months ago.  Pt has already had a menstrual cycle this month.  Pt states she has filled a pad per hour for the past 4 hours.  Pt denies any SHOB or dizziness.  Pt does admit to some nausea.

## 2021-06-05 NOTE — ED Provider Notes (Signed)
[facility] Emergency Department Provider Note  ____________________________________________  Time seen: Approximately 3:08 PM  I have reviewed the triage vital signs and the nursing notes.   HISTORY  Chief Complaint Vaginal Bleeding    HPI Angelica Mason is a 25 y.o. female who presents to the emergency department for treatment and evaluation of heavy vaginal bleeding x 2 days. She has a 43 month old baby. She is not currently on birth control. She denies pelvic pain or vaginal discharge prior to onset of bleeding. No fever or vomiting.   Past Medical History:  Diagnosis Date   Ovarian cyst     Patient Active Problem List   Diagnosis Date Noted   History of cesarean section    History of COVID-19 01/04/2021   Type A blood, Rh positive 01/04/2021   RLQ abdominal pain 12/22/2020    Past Surgical History:  Procedure Laterality Date   CESAREAN SECTION  02/26/2021   Procedure: CESAREAN SECTION;  Surgeon: Hildred Laser, MD;  Location: [facility] ORS;  Service: Obstetrics;;   NO PAST SURGERIES      Prior to Admission medications   Medication Sig Start Date End Date Taking? Authorizing Provider  cephALEXin (KEFLEX) 500 MG capsule Take 1 capsule (500 mg total) by mouth 2 (two) times daily for 7 days. 06/05/21 06/12/21 Yes Chandrea Zellman B, FNP  acetaminophen (TYLENOL) 325 MG tablet Take 650 mg by mouth every 6 (six) hours as needed. Patient not taking: Reported on 04/15/2021    [provider]  ferrous sulfate 325 (65 FE) MG tablet Take 1 tablet (325 mg total) by mouth 2 (two) times daily with a meal. Patient not taking: Reported on 04/15/2021 02/28/21   Doreene Burke, CNM  ibuprofen (ADVIL) 800 MG tablet Take 1 tablet (800 mg total) by mouth every 8 (eight) hours. Patient not taking: Reported on 04/15/2021 02/28/21   Doreene Burke, CNM  oxyCODONE (OXY IR/ROXICODONE) 5 MG immediate release tablet Take 1 tablet (5 mg total) by mouth every 4 (four) hours as  needed for moderate pain. Patient not taking: No sig reported 02/28/21   Doreene Burke, CNM  Prenatal Vit-Fe Fumarate-FA (PRENATAL MULTIVITAMIN) TABS tablet Take 1 tablet by mouth daily at 12 noon.    [provider]    Allergies Toradol [ketorolac tromethamine]  Family History  Problem Relation Age of Onset   Cancer Neg Hx     Social History Social History   Tobacco Use   Smoking status: Never   Smokeless tobacco: Never  Vaping Use   Vaping Use: Never used  Substance Use Topics   Alcohol use: Not Currently   Drug use: Not Currently    Review of Systems Constitutional: Negative for fever. Respiratory: Negative for shortness of breath or cough. Gastrointestinal: Negative for abdominal pain; negative for nausea , negative for vomiting. Genitourinary: Positive for dysuria , negative for vaginal discharge. Musculoskeletal: Negative for back pain. Skin: Negative for acute skin changes/rash/lesion. ____________________________________________   PHYSICAL EXAM:  VITAL SIGNS: ED Triage Vitals [06/05/21 1320]  Enc Vitals Group     BP (!) 143/97     Pulse Rate 84     Resp 18     Temp 98.3 F (36.8 C)     Temp Source Oral     SpO2 98 %     Weight 189 lb (85.7 kg)     Height 5\' 3"  (1.6 m)     Head Circumference      Peak Flow  Pain Score 0     Pain Loc      Pain Edu?      Excl. in GC?     Constitutional: Alert and oriented. Well appearing and in no acute distress. Eyes: Conjunctivae are normal. Head: Atraumatic. Nose: No congestion/rhinnorhea. Mouth/Throat: Mucous membranes are moist. Respiratory: Normal respiratory effort.  No retractions. Gastrointestinal: Bowel sounds active x 4; Abdomen is soft without rebound or guarding. Genitourinary: Pelvic exam: Deferred Musculoskeletal: No extremity tenderness nor edema.  Neurologic:  Normal speech and language. No gross focal neurologic deficits are appreciated. Speech is normal. No gait  instability. Skin:  Skin is warm, dry and intact. No rash noted on exposed skin. Psychiatric: Mood and affect are normal. Speech and behavior are normal.  ____________________________________________   LABS (all labs ordered are listed, but only abnormal results are displayed)  Labs Reviewed  URINALYSIS, COMPLETE (UACMP) WITH MICROSCOPIC - Abnormal; Notable for the following components:      Result Value   Color, Urine YELLOW (*)    APPearance HAZY (*)    Hgb urine dipstick LARGE (*)    Leukocytes,Ua MODERATE (*)    WBC, UA >50 (*)    All other components within normal limits  CBC  BASIC METABOLIC PANEL  POC URINE PREG, ED   ____________________________________________  RADIOLOGY  Ultrasound shows no acute concerns. ____________________________________________  Procedures  ____________________________________________  25 year old female presenting to the emergency department for treatment and evaluation of symptoms as described in the HPI.  Urinalysis is concerning for an acute cystitis.  Ultrasound is negative for ovarian cyst, fibroids, or other findings that would cause an increase in vaginal bleeding.  Patient stable for discharge.  She will be treated with antibiotics for the UTI and encouraged to see primary care/gynecology if vaginal bleeding continues for more than another 24 to 48 hours.  INITIAL IMPRESSION / ASSESSMENT AND PLAN / ED COURSE  Pertinent labs & imaging results that were available during my care of the patient were reviewed by me and considered in my medical decision making (see chart for details).  ____________________________________________   FINAL CLINICAL IMPRESSION(S) / ED DIAGNOSES  Final diagnoses:  Abnormal vaginal bleeding  Acute cystitis without hematuria    Note:  This document was prepared using Dragon voice recognition software and may include unintentional dictation errors.   Chinita Pester, FNP 06/05/21 2017    Shaune Pollack, MD 06/05/21 2248

## 2021-06-06 ENCOUNTER — Telehealth: Payer: Self-pay | Admitting: *Deleted

## 2021-06-06 NOTE — Telephone Encounter (Signed)
Transition Care Management Unsuccessful Follow-up Telephone Call  Date of discharge and from where:  06/05/2021 [facility] ED  Attempts:  1st Attempt  Reason for unsuccessful TCM follow-up call:  Left voice message

## 2021-06-07 DIAGNOSIS — Z419 Encounter for procedure for purposes other than remedying health state, unspecified: Secondary | ICD-10-CM | POA: Diagnosis not present

## 2021-06-07 NOTE — Telephone Encounter (Signed)
Transition Care Management Follow-up Telephone Call Date of discharge and from where: 06/05/2021 [facility] ED How have you been since you were released from the hospital? "I am doing okay" Any questions or concerns? No  Items Reviewed: Did the pt receive and understand the discharge instructions provided? Yes  Medications obtained and verified? Yes  Other? No  Any new allergies since your discharge? No  Dietary orders reviewed? No Do you have support at home? Yes    Functional Questionnaire: (I = Independent and D = Dependent) ADLs: I  Bathing/Dressing- I  Meal Prep- I  Eating- I  Maintaining continence- I  Transferring/Ambulation- I  Managing Meds- I  Follow up appointments reviewed:  PCP Hospital f/u appt confirmed? No   Specialist Hospital f/u appt confirmed? No   Are transportation arrangements needed?  N/A If their condition worsens, is the pt aware to call PCP or go to the Emergency Dept.? Yes Was the patient provided with contact information for the PCP's office or ED? Yes Was to pt encouraged to call back with questions or concerns? Yes

## 2021-07-08 DIAGNOSIS — Z419 Encounter for procedure for purposes other than remedying health state, unspecified: Secondary | ICD-10-CM | POA: Diagnosis not present

## 2021-07-26 ENCOUNTER — Ambulatory Visit (INDEPENDENT_AMBULATORY_CARE_PROVIDER_SITE_OTHER): Payer: Medicaid Other | Admitting: Obstetrics and Gynecology

## 2021-07-26 ENCOUNTER — Other Ambulatory Visit: Payer: Self-pay

## 2021-07-26 ENCOUNTER — Encounter: Payer: Self-pay | Admitting: Obstetrics and Gynecology

## 2021-07-26 ENCOUNTER — Other Ambulatory Visit (HOSPITAL_COMMUNITY)
Admission: RE | Admit: 2021-07-26 | Discharge: 2021-07-26 | Disposition: A | Discharge status: Home / Self Care | Payer: Medicaid Other | Source: Ambulatory Visit | Attending: Obstetrics and Gynecology | Admitting: Obstetrics and Gynecology

## 2021-07-26 VITALS — BP 107/72 | HR 102 | Resp 16 | Ht 63.0 in | Wt 188.1 lb

## 2021-07-26 DIAGNOSIS — N920 Excessive and frequent menstruation with regular cycle: Secondary | ICD-10-CM | POA: Diagnosis not present

## 2021-07-26 DIAGNOSIS — Z113 Encounter for screening for infections with a predominantly sexual mode of transmission: Secondary | ICD-10-CM | POA: Insufficient documentation

## 2021-07-26 MED ORDER — TRANEXAMIC ACID 650 MG PO TABS: 1300.0000 mg | ORAL_TABLET | Freq: Three times a day (TID) | ORAL | 2 refills | Status: AC

## 2021-07-26 NOTE — Progress Notes (Signed)
    GYNECOLOGY PROGRESS NOTE  Subjective:    Patient ID: Angelica Mason, female    DOB: 1996/12/07, 25 y.o.   MRN: [id number]  HPI  Patient is a 25 y.o. [id number] female who presents for STD testing. She notes that she is no longer in a relatinship with her child's father, but she continues to have intercourse with him occasioanally and just wants to be checked. She denies having any symptoms such as discharge, odor, itching or burning.  Patient also expresses that her cycles have worsened since the birth of her last child 5 months ago. Notes cycles are much heavier, soaking through pads.  Lasting 5 days. Also more painful.   The following portions of the patient's history were reviewed and updated as appropriate: allergies, current medications, past family history, past medical history, past social history, past surgical history, and problem list.  Review of Systems Pertinent items noted in HPI and remainder of comprehensive ROS otherwise negative.   Objective:   Blood pressure 107/72, pulse (!) 102, resp. rate 16, height 5\' 3"  (1.6 m), weight 188 lb 1.6 oz (85.3 kg), last menstrual period 07/26/2021, not currently breastfeeding.  Body mass index is 33.32 kg/m.  General appearance: alert and cooperative Abdomen: soft, non-tender; bowel sounds normal; no masses,  no organomegaly Pelvic: exam deferred.   Assessment:   1. Screen for STD (sexually transmitted disease)   2. Menorrhagia with regular cycle      Plan:   1. Screen for STD (sexually transmitted disease) - STD screening performed today (desires vaginal/cervical cultures only). Declined serology screening.  - Counseled on safe sex practices.   2. Menorrhagia with regular cycle - Patient with menorrhagia since birth of her last child. Discussed hormonal and non-hormonal options for management. Patient opts for non-hormonal. Will try natural supplements (such as increasing PO intake of soy, Magnesium supplements, dietary  changes. Also desires to have prescription for Lysteda in case she desires to start. Will prescribe. Patient to f/u in 1-2 months if she decides to begin medication.     Return to clinic for any scheduled appointments or for any gynecologic concerns as needed.    07/28/2021, MD [facility]

## 2021-07-27 ENCOUNTER — Encounter: Payer: Self-pay | Admitting: Obstetrics and Gynecology

## 2021-07-30 ENCOUNTER — Emergency Department
Admission: EM | Admit: 2021-07-30 | Discharge: 2021-07-30 | Disposition: A | Discharge status: Home / Self Care | Payer: Medicaid Other | Attending: Student in an Organized Health Care Education/Training Program | Admitting: Student in an Organized Health Care Education/Training Program

## 2021-07-30 ENCOUNTER — Other Ambulatory Visit: Payer: Self-pay

## 2021-07-30 DIAGNOSIS — R509 Fever, unspecified: Secondary | ICD-10-CM

## 2021-07-30 DIAGNOSIS — Z8616 Personal history of COVID-19: Secondary | ICD-10-CM | POA: Insufficient documentation

## 2021-07-30 DIAGNOSIS — Z20822 Contact with and (suspected) exposure to covid-19: Secondary | ICD-10-CM | POA: Insufficient documentation

## 2021-07-30 DIAGNOSIS — N739 Female pelvic inflammatory disease, unspecified: Secondary | ICD-10-CM | POA: Insufficient documentation

## 2021-07-30 DIAGNOSIS — N730 Acute parametritis and pelvic cellulitis: Secondary | ICD-10-CM

## 2021-07-30 LAB — CERVICOVAGINAL ANCILLARY ONLY
Chlamydia: POSITIVE — AB
Comment: NEGATIVE
Comment: NEGATIVE
Comment: NORMAL
Neisseria Gonorrhea: POSITIVE — AB
Trichomonas: NEGATIVE

## 2021-07-30 LAB — URINALYSIS, COMPLETE (UACMP) WITH MICROSCOPIC
Bacteria, UA: NONE SEEN
Bilirubin Urine: NEGATIVE
Glucose, UA: NEGATIVE mg/dL
Hgb urine dipstick: NEGATIVE
Ketones, ur: NEGATIVE mg/dL
Nitrite: NEGATIVE
Protein, ur: NEGATIVE mg/dL
Specific Gravity, Urine: 1.027 (ref 1.005–1.030)
pH: 5 (ref 5.0–8.0)

## 2021-07-30 LAB — PREGNANCY, URINE: Preg Test, Ur: NEGATIVE

## 2021-07-30 LAB — RESP PANEL BY RT-PCR (FLU A&B, COVID) ARPGX2
Influenza A by PCR: NEGATIVE
Influenza B by PCR: NEGATIVE
SARS Coronavirus 2 by RT PCR: NEGATIVE

## 2021-07-30 MED ORDER — CEFTRIAXONE SODIUM 1 G IJ SOLR
500.0000 mg | Freq: Once | INTRAMUSCULAR | Status: AC
  Administered 2021-07-30: 500 mg via INTRAMUSCULAR
  Filled 2021-07-30: qty 10

## 2021-07-30 MED ORDER — DOXYCYCLINE MONOHYDRATE 100 MG PO TABS: 100.0000 mg | ORAL_TABLET | Freq: Two times a day (BID) | ORAL | 0 refills | Status: DC

## 2021-07-30 MED ORDER — ACETAMINOPHEN 500 MG PO TABS
1000.0000 mg | ORAL_TABLET | Freq: Once | ORAL | Status: AC
  Administered 2021-07-30: 1000 mg via ORAL

## 2021-07-30 MED ORDER — METRONIDAZOLE 500 MG PO TABS: 500.0000 mg | ORAL_TABLET | Freq: Two times a day (BID) | ORAL | 0 refills | Status: AC

## 2021-07-30 NOTE — ED Notes (Signed)
E-signature pad unavailable - Pt verbalized understanding of D/C information - no additional concerns at this time.  

## 2021-07-30 NOTE — Discharge Instructions (Addendum)
You can take Tylenol and ibuprofen alternating for fever. Please take doxycycline twice daily for the next 14 days. Please take Flagyl twice daily for the next 14 days.

## 2021-07-30 NOTE — ED Provider Notes (Signed)
[facility]-EMERGENCY DEPARTMENT  ____________________________________________  Time seen: Approximately 9:17 PM  I have reviewed the triage vital signs and the nursing notes.   HISTORY  Chief Complaint Generalized Body Aches and Fatigue   Historian Patient     HPI Angelica Mason is a 25 y.o. female presents to the emergency department with low back pain as well as some generalized body aches and nasal congestion.  Patient reports that she recently returned from a trip to [place].  She denies chest pain, chest tightness or cough.  She reports that she recently had an STD screening and tested positive for both gonorrhea and chlamydia but has not received treatment yet.  She denies pelvic pain or abdominal pain.  No deep dyspareunia.  She denies changes in vaginal discharge to her knowledge.  No nausea, vomiting or diarrhea.  No other alleviating measures have been attempted.   Past Medical History:  Diagnosis Date   Ovarian cyst      Immunizations up to date:  Yes.     Past Medical History:  Diagnosis Date   Ovarian cyst     Patient Active Problem List   Diagnosis Date Noted   History of cesarean section    History of COVID-19 01/04/2021   Type A blood, Rh positive 01/04/2021   RLQ abdominal pain 12/22/2020    Past Surgical History:  Procedure Laterality Date   CESAREAN SECTION  02/26/2021   Procedure: CESAREAN SECTION;  Surgeon: Hildred Laser, MD;  Location: [facility] ORS;  Service: Obstetrics;;   NO PAST SURGERIES      Prior to Admission medications   Medication Sig Start Date End Date Taking? Authorizing Provider  doxycycline (ADOXA) 100 MG tablet Take 1 tablet (100 mg total) by mouth 2 (two) times daily for 14 days. 07/30/21 08/13/21 Yes Pia Mau M, PA-C  metroNIDAZOLE (FLAGYL) 500 MG tablet Take 1 tablet (500 mg total) by mouth 2 (two) times daily for 14 days. 07/30/21 08/13/21 Yes Pia Mau M, PA-C  acetaminophen (TYLENOL) 325 MG tablet Take 650 mg by mouth  every 6 (six) hours as needed.    [provider]  ferrous sulfate 325 (65 FE) MG tablet Take 1 tablet (325 mg total) by mouth 2 (two) times daily with a meal. 02/28/21   Doreene Burke, CNM  ibuprofen (ADVIL) 800 MG tablet Take 1 tablet (800 mg total) by mouth every 8 (eight) hours. 02/28/21   Doreene Burke, CNM  oxyCODONE (OXY IR/ROXICODONE) 5 MG immediate release tablet Take 1 tablet (5 mg total) by mouth every 4 (four) hours as needed for moderate pain. Patient not taking: No sig reported 02/28/21   Doreene Burke, CNM  Prenatal Vit-Fe Fumarate-FA (PRENATAL MULTIVITAMIN) TABS tablet Take 1 tablet by mouth daily at 12 noon.    [provider]  tranexamic acid (LYSTEDA) 650 MG TABS tablet Take 2 tablets (1,300 mg total) by mouth 3 (three) times daily. Take during menses for a maximum of five days 07/26/21   Hildred Laser, MD    Allergies Toradol [ketorolac tromethamine]  Family History  Problem Relation Age of Onset   Cancer Neg Hx     Social History Social History   Tobacco Use   Smoking status: Never   Smokeless tobacco: Never  Vaping Use   Vaping Use: Never used  Substance Use Topics   Alcohol use: Not Currently   Drug use: Not Currently     Review of Systems  Constitutional: Patient has fever.  Eyes:  No discharge ENT:  No upper respiratory complaints. Respiratory: no cough. No SOB/ use of accessory muscles to breath Gastrointestinal:   No nausea, no vomiting.  No diarrhea.  No constipation. Musculoskeletal: Negative for musculoskeletal pain. Skin: Negative for rash, abrasions, lacerations, ecchymosis.    ____________________________________________   PHYSICAL EXAM:  VITAL SIGNS: ED Triage Vitals  Enc Vitals Group     BP 07/30/21 1959 (!) 125/97     Pulse Rate 07/30/21 1959 100     Resp 07/30/21 1959 16     Temp 07/30/21 1959 (!) 101.1 F (38.4 C)     Temp Source 07/30/21 1959 Oral     SpO2 07/30/21 1959 98 %     Weight 07/30/21 1958 188  lb (85.3 kg)     Height 07/30/21 1958 5\' 3"  (1.6 m)     Head Circumference --      Peak Flow --      Pain Score 07/30/21 1958 8     Pain Loc --      Pain Edu? --      Excl. in GC? --      Constitutional: Alert and oriented. Patient is lying supine. Eyes: Conjunctivae are normal. PERRL. EOMI. Head: Atraumatic. ENT:      Ears: Tympanic membranes are mildly injected with mild effusion bilaterally.       Nose: No congestion/rhinnorhea.      Mouth/Throat: Mucous membranes are moist. Posterior pharynx is mildly erythematous.  Hematological/Lymphatic/Immunilogical: No cervical lymphadenopathy.  Cardiovascular: Normal rate, regular rhythm. Normal S1 and S2.  Good peripheral circulation. Respiratory: Normal respiratory effort without tachypnea or retractions. Lungs CTAB. Good air entry to the bases with no decreased or absent breath sounds. Gastrointestinal: Bowel sounds 4 quadrants. Soft and nontender to palpation. No guarding or rigidity. No palpable masses. No distention. No CVA tenderness. Musculoskeletal: Full range of motion to all extremities. No gross deformities appreciated. Neurologic:  Normal speech and language. No gross focal neurologic deficits are appreciated.  Skin:  Skin is warm, dry and intact. No rash noted. Psychiatric: Mood and affect are normal. Speech and behavior are normal. Patient exhibits appropriate insight and judgement.   ____________________________________________   LABS (all labs ordered are listed, but only abnormal results are displayed)  Labs Reviewed  URINALYSIS, COMPLETE (UACMP) WITH MICROSCOPIC - Abnormal; Notable for the following components:      Result Value   Color, Urine YELLOW (*)    APPearance HAZY (*)    Leukocytes,Ua TRACE (*)    All other components within normal limits  RESP PANEL BY RT-PCR (FLU A&B, COVID) ARPGX2  PREGNANCY, URINE  POC URINE PREG, ED    ____________________________________________  EKG   ____________________________________________  RADIOLOGY   No results found.  ____________________________________________    PROCEDURES  Procedure(s) performed:     Procedures     Medications  acetaminophen (TYLENOL) tablet 1,000 mg (1,000 mg Oral Given 07/30/21 2002)  cefTRIAXone (ROCEPHIN) injection 500 mg (500 mg Intramuscular Given 07/30/21 2106)     ____________________________________________   INITIAL IMPRESSION / ASSESSMENT AND PLAN / ED COURSE  Pertinent labs & imaging results that were available during my care of the patient were reviewed by me and considered in my medical decision making (see chart for details).       Assessment and plan Fever 25 year old female presents to the emergency department with fever and concern for gonorrhea and chlamydia.  Patient was febrile at triage and mildly tachycardic.  She had no abdominal or pelvic tenderness to palpation.  Urinalysis  shows trace leukocytes but no other findings concerning for UTI.  Patient tested negative for COVID-19 and influenza.  Urine pregnancy was negative.  Patient received IM Rocephin in the emergency department.  We will treat for PID with doxycycline twice daily for the next 14 days and Flagyl twice daily for the next 14 days.  Return precautions were given to return with new or worsening symptoms.  All patient questions were answered.    ____________________________________________  FINAL CLINICAL IMPRESSION(S) / ED DIAGNOSES  Final diagnoses:  Fever, unspecified fever cause  PID (acute pelvic inflammatory disease)      NEW MEDICATIONS STARTED DURING THIS VISIT:  ED Discharge Orders          Ordered    doxycycline (ADOXA) 100 MG tablet  2 times daily        07/30/21 2131    metroNIDAZOLE (FLAGYL) 500 MG tablet  2 times daily        07/30/21 2131                This chart was dictated using voice  recognition software/Dragon. Despite best efforts to proofread, errors can occur which can change the meaning. Any change was purely unintentional.     Gasper Lloyd 07/30/21 2136    Willy Eddy, MD 07/31/21 1040

## 2021-07-30 NOTE — ED Triage Notes (Signed)
Pt states starting last night she began to feel light headed and have generalized body aches. Pt states she was just on vacation in [place]. Pt states denies cough but states that she does feel congested.

## 2021-07-31 ENCOUNTER — Telehealth: Payer: Self-pay

## 2021-07-31 ENCOUNTER — Telehealth: Payer: Self-pay | Admitting: Obstetrics and Gynecology

## 2021-07-31 MED ORDER — AZITHROMYCIN 500 MG PO TABS: ORAL_TABLET | ORAL | 2 refills | Status: DC

## 2021-07-31 NOTE — Telephone Encounter (Signed)
Transition Care Management Unsuccessful Follow-up Telephone Call  Date of discharge and from where:  07/30/2021-[facility]  Attempts:  1st Attempt  Reason for unsuccessful TCM follow-up call:  Unable to reach patient

## 2021-07-31 NOTE — Telephone Encounter (Signed)
Patient called no answer. Lm on vm stating that I was calling over test results and schedule her for treatment. Pt was advised to please contact the office as soon as possible.

## 2021-07-31 NOTE — Telephone Encounter (Signed)
Called pt no and unable to leave a message via vm.

## 2021-07-31 NOTE — Telephone Encounter (Signed)
Patient states that she thought she had Covid and went to the ER and her dx ended up being the flu.  She stated that while she was there she received her first treatment for her STD diagnosis.  She stated that they called in a prescription of Doxycycline and they called in the wrong one. She states that her insurance will only pay for the Doxycycline Hyclate.  Patient is asking if we could call in Doxy hyclate to replace the Doxy..please advise

## 2021-08-01 NOTE — Telephone Encounter (Signed)
Transition Care Management Unsuccessful Follow-up Telephone Call  Date of discharge and from where:  07/30/2021-[facility]  Attempts:  2nd Attempt  Reason for unsuccessful TCM follow-up call:  Unable to reach patient

## 2021-08-02 NOTE — Telephone Encounter (Signed)
Patient called no answer. Informed pt that I did not want to leave a detailed message on her vm due to not sure who may hear the message. I informed the pt that I had sent her a mychart message a day ago and asked her to please read the message or reach out to the office if she had any problems or concerns.

## 2021-08-04 NOTE — Telephone Encounter (Signed)
Transition Care Management Unsuccessful Follow-up Telephone Call  Date of discharge and from where:  07/30/2021 from [facility]  Attempts:  3rd Attempt  Reason for unsuccessful TCM follow-up call:  Unable to reach patient    

## 2021-08-08 DIAGNOSIS — Z419 Encounter for procedure for purposes other than remedying health state, unspecified: Secondary | ICD-10-CM | POA: Diagnosis not present

## 2021-08-20 ENCOUNTER — Ambulatory Visit (INDEPENDENT_AMBULATORY_CARE_PROVIDER_SITE_OTHER): Payer: Self-pay | Admitting: Obstetrics and Gynecology

## 2021-08-20 ENCOUNTER — Other Ambulatory Visit (HOSPITAL_COMMUNITY)
Admission: RE | Admit: 2021-08-20 | Discharge: 2021-08-20 | Disposition: A | Discharge status: Home / Self Care | Payer: Medicaid Other | Source: Ambulatory Visit | Attending: Obstetrics and Gynecology | Admitting: Obstetrics and Gynecology

## 2021-08-20 ENCOUNTER — Encounter: Payer: Self-pay | Admitting: Obstetrics and Gynecology

## 2021-08-20 ENCOUNTER — Other Ambulatory Visit: Payer: Self-pay

## 2021-08-20 VITALS — BP 117/74 | HR 79 | Resp 16 | Ht 63.0 in | Wt 187.7 lb

## 2021-08-20 DIAGNOSIS — Z13220 Encounter for screening for lipoid disorders: Secondary | ICD-10-CM

## 2021-08-20 DIAGNOSIS — E669 Obesity, unspecified: Secondary | ICD-10-CM

## 2021-08-20 DIAGNOSIS — Z131 Encounter for screening for diabetes mellitus: Secondary | ICD-10-CM

## 2021-08-20 DIAGNOSIS — Z8619 Personal history of other infectious and parasitic diseases: Secondary | ICD-10-CM | POA: Insufficient documentation

## 2021-08-20 DIAGNOSIS — N920 Excessive and frequent menstruation with regular cycle: Secondary | ICD-10-CM

## 2021-08-20 DIAGNOSIS — M25531 Pain in right wrist: Secondary | ICD-10-CM

## 2021-08-20 DIAGNOSIS — Z01419 Encounter for gynecological examination (general) (routine) without abnormal findings: Secondary | ICD-10-CM

## 2021-08-20 NOTE — Patient Instructions (Signed)
Breast Self-Awareness Breast self-awareness is knowing how your breasts look and feel. Doing breast self-awareness is important. It allows you to catch a breast problem early while it is still small and can be treated. All women should do breast self-awareness, including women who have had breast implants. Tell your doctor if you notice a change in your breasts. What you need: A mirror. A well-lit room. How to do a breast self-exam A breast self-exam is one way to learn what is normal for your breasts and to check for changes. To do a breast self-exam: Look for changes  Take off all the clothes above your waist. Stand in front of a mirror in a room with good lighting. Put your hands on your hips. Push your hands down. Look at your breasts and nipples in the mirror to see if one breast or nipple looks different from the other. Check to see if: The shape of one breast is different. The size of one breast is different. There are wrinkles, dips, and bumps in one breast and not the other. Look at each breast for changes in the skin, such as: Redness. Scaly areas. Look for changes in your nipples, such as: Liquid around the nipples. Bleeding. Dimpling. Redness. A change in where the nipples are. Feel for changes  Lie on your back on the floor. Feel each breast. To do this, follow these steps: Pick a breast to feel. Put the arm closest to that breast above your head. Use your other arm to feel the nipple area of your breast. Feel the area with the pads of your three middle fingers by making small circles with your fingers. For the first circle, press lightly. For the second circle, press harder. For the third circle, press even harder. Keep making circles with your fingers at the different pressures as you move down your breast. Stop when you feel your ribs. Move your fingers a little toward the center of your body. Start making circles with your fingers again, this time going up until  you reach your collarbone. Keep making up-and-down circles until you reach your armpit. Remember to keep using the three pressures. Feel the other breast in the same way. Sit or stand in the tub or shower. With soapy water on your skin, feel each breast the same way you did in step 2 when you were lying on the floor. Write down what you find Writing down what you find can help you remember what to tell your doctor. Write down: What is normal for each breast. Any changes you find in each breast, including: The kind of changes you find. Whether you have pain. Size and location of any lumps. When you last had your menstrual period. General tips Check your breasts every month. If you are breastfeeding, the best time to check your breasts is after you feed your baby or after you use a breast pump. If you get menstrual periods, the best time to check your breasts is 5-7 days after your menstrual period is over. With time, you will become comfortable with the self-exam, and you will begin to know if there are changes in your breasts. Contact a doctor if you: See a change in the shape or size of your breasts or nipples. See a change in the skin of your breast or nipples, such as red or scaly skin. Have fluid coming from your nipples that is not normal. Find a lump or thick area that was not there before. Have pain in  your breasts. Have any concerns about your breast health. Summary Breast self-awareness includes looking for changes in your breasts, as well as feeling for changes within your breasts. Breast self-awareness should be done in front of a mirror in a well-lit room. You should check your breasts every month. If you get menstrual periods, the best time to check your breasts is 5-7 days after your menstrual period is over. Let your doctor know of any changes you see in your breasts, including changes in size, changes on the skin, pain or tenderness, or fluid from your nipples that is not  normal. This information is not intended to replace advice given to you by your health care provider. Make sure you discuss any questions you have with your health care provider. Document Revised: 07/13/2018 Document Reviewed: 07/13/2018 Elsevier Patient Education  2022 [employer] Preventive Care 21-39 Years Old, Female Preventive care refers to lifestyle choices and visits with your health care provider that can promote health and wellness. This includes: A yearly physical exam. This is also called an annual wellness visit. Regular dental and eye exams. Immunizations. Screening for certain conditions. Healthy lifestyle choices, such as: Eating a healthy diet. Getting regular exercise. Not using drugs or products that contain nicotine and tobacco. Limiting alcohol use. What can I expect for my preventive care visit? Physical exam Your health care provider may check your: Height and weight. These may be used to calculate your BMI (body mass index). BMI is a measurement that tells if you are at a healthy weight. Heart rate and blood pressure. Body temperature. Skin for abnormal spots. Counseling Your health care provider may ask you questions about your: Past medical problems. Family's medical history. Alcohol, tobacco, and drug use. Emotional well-being. Home life and relationship well-being. Sexual activity. Diet, exercise, and sleep habits. Work and work environment. Access to firearms. Method of birth control. Menstrual cycle. Pregnancy history. What immunizations do I need? Vaccines are usually given at various ages, according to a schedule. Your health care provider will recommend vaccines for you based on your age, medical history, and lifestyle or other factors, such as travel or where you work. What tests do I need? Blood tests Lipid and cholesterol levels. These may be checked every 5 years starting at age 20. Hepatitis C test. Hepatitis B  test. Screening Diabetes screening. This is done by checking your blood sugar (glucose) after you have not eaten for a while (fasting). STD (sexually transmitted disease) testing, if you are at risk. BRCA-related cancer screening. This may be done if you have a family history of breast, ovarian, tubal, or peritoneal cancers. Pelvic exam and Pap test. This may be done every 3 years starting at age 21. Starting at age 30, this may be done every 5 years if you have a Pap test in combination with an HPV test. Talk with your health care provider about your test results, treatment options, and if necessary, the need for more tests. Follow these instructions at home: Eating and drinking  Eat a healthy diet that includes fresh fruits and vegetables, whole grains, lean protein, and low-fat dairy products. Take vitamin and mineral supplements as recommended by your health care provider. Do not drink alcohol if: Your health care provider tells you not to drink. You are pregnant, may be pregnant, or are planning to become pregnant. If you drink alcohol: Limit how much you have to 0-1 drink a day. Be aware of how much alcohol is in your drink. In the [place]   one drink equals one 12 oz bottle of beer (355 mL), one 5 oz glass of wine (148 mL), or one 1 oz glass of hard liquor (44 mL). Lifestyle Take daily care of your teeth and gums. Brush your teeth every morning and night with fluoride toothpaste. Floss one time each day. Stay active. Exercise for at least 30 minutes 5 or more days each week. Do not use any products that contain nicotine or tobacco, such as cigarettes, e-cigarettes, and chewing tobacco. If you need help quitting, ask your health care provider. Do not use drugs. If you are sexually active, practice safe sex. Use a condom or other form of protection to prevent STIs (sexually transmitted infections). If you do not wish to become pregnant, use a form of birth control. If you plan to become  pregnant, see your health care provider for a prepregnancy visit. Find healthy ways to cope with stress, such as: Meditation, yoga, or listening to music. Journaling. Talking to a trusted person. Spending time with friends and family. Safety Always wear your seat belt while driving or riding in a vehicle. Do not drive: If you have been drinking alcohol. Do not ride with someone who has been drinking. When you are tired or distracted. While texting. Wear a helmet and other protective equipment during sports activities. If you have firearms in your house, make sure you follow all gun safety procedures. Seek help if you have been physically or sexually abused. What's next? Go to your health care provider once a year for an annual wellness visit. Ask your health care provider how often you should have your eyes and teeth checked. Stay up to date on all vaccines. This information is not intended to replace advice given to you by your health care provider. Make sure you discuss any questions you have with your health care provider. Document Revised: 02/01/2021 Document Reviewed: 08/05/2018 Elsevier Patient Education  2022 [employer] Inc[employer]  

## 2021-08-20 NOTE — Progress Notes (Signed)
GYNECOLOGY ANNUAL PHYSICAL EXAM PROGRESS NOTE  Subjective:    Angelica Mason is a 25 y.o. [id number] female who presents for an annual exam. The patient has no complaints today. The patient is not sexually active. The patient participates in regular exercise: no. Has the patient ever been transfused or tattooed?: yes. The patient reports that there is not domestic violence in her life. Reports that she plans on relocating to [place] at the end of the year, as she has more family there.   Patient complains of :  Pelvic sensation, feels like vibration x 2 week that comes and goes. Right wrist pain x 3 months, comes and goes. Pain is sharp. 3.   Presents for [facility] for gonorrhea and chlamydia. Report self and partner treatment.  4.   Reports that she did not yet initiate the Lysteda as prescribed yet.    Menstrual History: Menarche age: 7 Patient's last menstrual period was 07/26/2021 (exact date). Period Cycle (Days): 28 Period Duration (Days): 5 Period Pattern: Regular Menstrual Flow: Heavy Menstrual Control: Maxi pad Menstrual Control Change Freq (Hours): 2 Dysmenorrhea: (!) Severe Dysmenorrhea Symptoms: Cramping, Throbbing, Nausea, Headache   Gynecologic History:  Contraception: abstinence History of STI's: yes Last Pap: Unsure, possibly 1 or 2 years ago. Results were: normal. No h/o abnormal pap smears. Last mammogram: Not applicable, due to age.     Upstream - 08/20/21 1118       Pregnancy Intention Screening   Does the patient want to become pregnant in the next year? No    Does the patient's partner want to become pregnant in the next year? No    Would the patient like to discuss contraceptive options today? No      Contraception Wrap Up   Current Method Abstinence    End Method Abstinence    Contraception Counseling Provided No            The pregnancy intention screening data noted above was reviewed. Potential methods of contraception were discussed.  The patient elected to proceed with Abstinence.    OB History  Gravida Para Term Preterm AB Living  3 1 1  0 2 1  SAB IAB Ectopic Multiple Live Births  0 0 0 0 1    # Outcome Date GA Lbr Len/2nd Weight Sex Delivery Anes PTL Lv  3 Term 02/26/21 [redacted]w[redacted]d  7 lb 4.8 oz (3.31 kg) M CS-LTranv EPI  LIV     Name: Mason,Angelica Angelica     Apgar1: 8  Apgar5: 9  2 AB           1 AB             Past Medical History:  Diagnosis Date   Ovarian cyst     Past Surgical History:  Procedure Laterality Date   CESAREAN SECTION  02/26/2021   Procedure: CESAREAN SECTION;  Surgeon: 02/28/2021, MD;  Location: [facility] ORS;  Service: Obstetrics;;   NO PAST SURGERIES      Family History  Problem Relation Age of Onset   Cancer Neg Hx     Social History   Socioeconomic History   Marital status: Single    Spouse name: Not on file   Number of children: Not on file   Years of education: Not on file   Highest education level: Not on file  Occupational History   Not on file  Tobacco Use   Smoking status: Never   Smokeless tobacco: Never  Vaping Use  Vaping Use: Never used  Substance and Sexual Activity   Alcohol use: Not Currently   Drug use: Not Currently   Sexual activity: Not Currently    Partners: Male    Birth control/protection: None  Other Topics Concern   Not on file  Social History Narrative   Not on file   Social Determinants of Health   Financial Resource Strain: Not on file  Food Insecurity: Not on file  Transportation Needs: Not on file  Physical Activity: Not on file  Stress: Not on file  Social Connections: Not on file  Intimate Partner Violence: Not on file    Current Outpatient Medications on File Prior to Visit  Medication Sig Dispense Refill   acetaminophen (TYLENOL) 325 MG tablet Take 650 mg by mouth every 6 (six) hours as needed.     ferrous sulfate 325 (65 FE) MG tablet Take 1 tablet (325 mg total) by mouth 2 (two) times daily with a meal. 60 tablet 3    ibuprofen (ADVIL) 800 MG tablet Take 1 tablet (800 mg total) by mouth every 8 (eight) hours. 30 tablet 0   Prenatal Vit-Fe Fumarate-FA (PRENATAL MULTIVITAMIN) TABS tablet Take 1 tablet by mouth daily at 12 noon.     tranexamic acid (LYSTEDA) 650 MG TABS tablet Take 2 tablets (1,300 mg total) by mouth 3 (three) times daily. Take during menses for a maximum of five days 30 tablet 2   No current facility-administered medications on file prior to visit.    Allergies  Allergen Reactions   Toradol [Ketorolac Tromethamine] Itching     Review of Systems Constitutional: negative for chills, fatigue, fevers and sweats Eyes: negative for irritation, redness and visual disturbance Ears, nose, mouth, throat, and face: negative for hearing loss, nasal congestion, snoring and tinnitus Respiratory: negative for asthma, cough, sputum Cardiovascular: negative for chest pain, dyspnea, exertional chest pressure/discomfort, irregular heart beat, palpitations and syncope Gastrointestinal: negative for abdominal pain, change in bowel habits, nausea and vomiting Genitourinary: negative for abnormal menstrual periods, genital lesions, sexual problems and vaginal discharge, dysuria and urinary incontinence. Positive for pelvic muscle spasms along right side.  Integument/breast: negative for breast lump, breast tenderness and nipple discharge Hematologic/lymphatic: negative for bleeding and easy bruising Musculoskeletal:negative for back pain and muscle weakness. Positive for right wrist pain (see HPI).  Neurological: negative for dizziness, headaches, vertigo and weakness Endocrine: negative for diabetic symptoms including polydipsia, polyuria and skin dryness Allergic/Immunologic: negative for hay fever and urticaria      Objective:  Blood pressure 117/74, pulse 79, resp. rate 16, height 5\' 3"  (1.6 m), weight 187 lb 11.2 oz (85.1 kg), last menstrual period 07/26/2021, not currently breastfeeding. Body mass  index is 33.25 kg/m.   General Appearance:    Alert, cooperative, no distress, appears stated age, mild obesity  Head:    Normocephalic, without obvious abnormality, atraumatic  Eyes:    PERRL, conjunctiva/corneas clear, EOM's intact, both eyes  Ears:    Normal external ear canals, both ears  Nose:   Nares normal, septum midline, mucosa normal, no drainage or sinus tenderness  Throat:   Lips, mucosa, and tongue normal; teeth and gums normal  Neck:   Supple, symmetrical, trachea midline, no adenopathy; thyroid: no enlargement/tenderness/nodules; no carotid bruit or JVD  Back:     Symmetric, no curvature, ROM normal, no CVA tenderness  Lungs:     Clear to auscultation bilaterally, respirations unlabored  Chest Wall:    No tenderness or deformity   Heart:  Regular rate and rhythm, S1 and S2 normal, no murmur, rub or gallop  Breast Exam:    No tenderness, masses, or nipple abnormality  Abdomen:     Soft, non-tender, bowel sounds active all four quadrants, no masses, no organomegaly.    Genitalia:    Pelvic:external genitalia normal, vagina with small amount of thin white discharge,  lesions, discharge, or tenderness, rectovaginal septum  normal. Cervix normal in appearance, no cervical motion tenderness, no adnexal masses or tenderness.  Uterus normal size, shape, mobile, regular contours, nontender.  Rectal:    Normal external sphincter.  No hemorrhoids appreciated. Internal exam not done.   Extremities:   Extremities normal, atraumatic, no cyanosis or edema  Pulses:   2+ and symmetric all extremities  Skin:   Skin color, texture, turgor normal, no rashes or lesions  Lymph nodes:   Cervical, supraclavicular, and axillary nodes normal  Neurologic:   CNII-XII intact, normal strength, sensation and reflexes throughout   .  Labs:  Lab Results  Component Value Date   WBC 7.9 06/05/2021   HGB 13.9 06/05/2021   HCT 41.2 06/05/2021   MCV 88.0 06/05/2021   PLT 226 06/05/2021    Lab Results   Component Value Date   CREATININE 0.61 06/05/2021   BUN 12 06/05/2021   NA 140 06/05/2021   K 4.1 06/05/2021   CL 106 06/05/2021   CO2 23 06/05/2021    Lab Results  Component Value Date   ALT 14 09/25/2020   AST 20 09/25/2020   ALKPHOS 41 09/25/2020   BILITOT 0.6 09/25/2020    No results found for: TSH   Assessment:   1. Well woman exam with routine gynecological exam   2. Screening for diabetes mellitus   3. Screening for lipid disorders   4. History of gonorrhea   5. History of chlamydia infection   6. Obesity (BMI 30.0-34.9)   7. Acute pain of right wrist   8. Menorrhagia with regular cycle      Plan:  Blood tests: Lipoproteins and Hemoglobin A1c. Breast self exam technique reviewed and patient encouraged to perform self-exam monthly. Contraception: abstinence. Discussed healthy lifestyle modifications. Mammogram  Not applicable Pap smear  performed today as no record of pap smear from [facility], [facility], or scanned records from [place].  Marland Kitchen COVID vaccination status: Declined Test of cure performed for recent gonorrhea and chlamydia infection.  Right wrist pain, no obvious deformities, no issues noted today. Advised on wrist brace.  Menorrhagia, has prescription for Lysteda to utilize as needed.  Follow up in 1 year for annual exam  Hildred Laser, MD [facility]

## 2021-08-21 LAB — LIPID PANEL
Chol/HDL Ratio: 3.2 ratio (ref 0.0–4.4)
Cholesterol, Total: 204 mg/dL — ABNORMAL HIGH (ref 100–199)
HDL: 64 mg/dL (ref >39)
LDL Chol Calc (NIH): 124 mg/dL — ABNORMAL HIGH (ref 0–99)
Triglycerides: 89 mg/dL (ref 0–149)
VLDL Cholesterol Cal: 16 mg/dL (ref 5–40)

## 2021-08-21 LAB — HEMOGLOBIN A1C
Est. average glucose Bld gHb Est-mCnc: 114 mg/dL
Hgb A1c MFr Bld: 5.6 % (ref 4.8–5.6)

## 2021-08-21 LAB — CERVICOVAGINAL ANCILLARY ONLY
Chlamydia: NEGATIVE
Comment: NEGATIVE
Comment: NORMAL
Neisseria Gonorrhea: NEGATIVE

## 2021-09-07 DIAGNOSIS — Z419 Encounter for procedure for purposes other than remedying health state, unspecified: Secondary | ICD-10-CM | POA: Diagnosis not present

## 2021-09-19 ENCOUNTER — Emergency Department
Admission: EM | Admit: 2021-09-19 | Discharge: 2021-09-19 | Discharge status: Against Medical Advice | Payer: Medicaid Other

## 2021-09-19 DIAGNOSIS — R079 Chest pain, unspecified: Secondary | ICD-10-CM | POA: Diagnosis not present

## 2021-09-19 DIAGNOSIS — H538 Other visual disturbances: Secondary | ICD-10-CM | POA: Diagnosis not present

## 2021-09-19 DIAGNOSIS — Z5329 Procedure and treatment not carried out because of patient's decision for other reasons: Secondary | ICD-10-CM | POA: Diagnosis not present

## 2021-09-19 DIAGNOSIS — R519 Headache, unspecified: Secondary | ICD-10-CM | POA: Diagnosis not present

## 2021-09-20 ENCOUNTER — Emergency Department: Payer: Medicaid Other

## 2021-09-20 ENCOUNTER — Emergency Department
Admission: EM | Admit: 2021-09-20 | Discharge: 2021-09-20 | Disposition: A | Discharge status: Home / Self Care | Payer: Medicaid Other | Attending: Emergency Medicine | Admitting: Emergency Medicine

## 2021-09-20 ENCOUNTER — Other Ambulatory Visit: Payer: Self-pay

## 2021-09-20 ENCOUNTER — Encounter: Payer: Self-pay | Admitting: Emergency Medicine

## 2021-09-20 DIAGNOSIS — M654 Radial styloid tenosynovitis [de Quervain]: Secondary | ICD-10-CM | POA: Diagnosis not present

## 2021-09-20 DIAGNOSIS — Z8616 Personal history of COVID-19: Secondary | ICD-10-CM | POA: Insufficient documentation

## 2021-09-20 DIAGNOSIS — M25531 Pain in right wrist: Secondary | ICD-10-CM | POA: Insufficient documentation

## 2021-09-20 DIAGNOSIS — R519 Headache, unspecified: Secondary | ICD-10-CM | POA: Diagnosis present

## 2021-09-20 DIAGNOSIS — G43009 Migraine without aura, not intractable, without status migrainosus: Secondary | ICD-10-CM | POA: Insufficient documentation

## 2021-09-20 DIAGNOSIS — G43909 Migraine, unspecified, not intractable, without status migrainosus: Secondary | ICD-10-CM | POA: Diagnosis not present

## 2021-09-20 MED ORDER — DEXAMETHASONE SODIUM PHOSPHATE 10 MG/ML IJ SOLN
10.0000 mg | Freq: Once | INTRAMUSCULAR | Status: AC
  Administered 2021-09-20: 10 mg via INTRAVENOUS
  Filled 2021-09-20: qty 1

## 2021-09-20 MED ORDER — DIPHENHYDRAMINE HCL 50 MG/ML IJ SOLN
25.0000 mg | Freq: Once | INTRAMUSCULAR | Status: AC
  Administered 2021-09-20: 25 mg via INTRAVENOUS
  Filled 2021-09-20: qty 1

## 2021-09-20 MED ORDER — MAGNESIUM SULFATE 2 GM/50ML IV SOLN
2.0000 g | Freq: Once | INTRAVENOUS | Status: DC
  Filled 2021-09-20: qty 50

## 2021-09-20 MED ORDER — PROCHLORPERAZINE EDISYLATE 10 MG/2ML IJ SOLN
10.0000 mg | Freq: Once | INTRAMUSCULAR | Status: AC
  Administered 2021-09-20: 10 mg via INTRAVENOUS
  Filled 2021-09-20: qty 2

## 2021-09-20 MED ORDER — ACETAMINOPHEN 500 MG PO TABS
1000.0000 mg | ORAL_TABLET | Freq: Once | ORAL | Status: DC
  Filled 2021-09-20: qty 2

## 2021-09-20 MED ORDER — SODIUM CHLORIDE 0.9 % IV BOLUS (SEPSIS)
1000.0000 mL | Freq: Once | INTRAVENOUS | Status: AC
  Administered 2021-09-20: 1000 mL via INTRAVENOUS

## 2021-09-20 MED ORDER — MELOXICAM 15 MG PO TABS: 15.0000 mg | ORAL_TABLET | Freq: Every day | ORAL | 2 refills | Status: AC

## 2021-09-20 NOTE — ED Notes (Addendum)
Pt refused IV insertion and meds at this time . ERMD made aware  Pt called friend/family member that can come to ED take watch for her baby , and drive this pt home

## 2021-09-20 NOTE — Discharge Instructions (Addendum)
  Your x-ray showed no fracture or dislocation.  I recommend that you wear your splint at all times to help with pain.  Please take your Mobic daily as prescribed.  You may apply ice to your hand, wrist several times a day for 10 to 15 minutes at a time which can also help with symptomatic relief.  If symptoms or not improving, I recommend follow-up with orthopedics.  You may take over-the-counter Tylenol 1000 mg every 6 hours as needed for headache.  Please do not take any extra ibuprofen, aspirin or Aleve while taking Mobic.  If you continue to have headaches, I recommend establishing care with a primary care physician as you may benefit from referral to neurology.  Steps to find a Primary Care Provider (PCP):  Call [phone] or [phone] to access "[facility] Find a Doctor Service."  2.  You may also go on the [facility] website at [email or web address]

## 2021-09-20 NOTE — ED Notes (Signed)
Pt's friend at bedside this time

## 2021-09-20 NOTE — ED Provider Notes (Signed)
DG Wrist Complete Right  Result Date: 09/20/2021 CLINICAL DATA:  Right wrist pain, atraumatic. EXAM: RIGHT WRIST - COMPLETE 3+ VIEW COMPARISON:  None. FINDINGS: There is no evidence of fracture or dislocation. There is no evidence of arthropathy or other focal bone abnormality. Soft tissues are unremarkable. IMPRESSION: Negative. Electronically Signed   By: Tiburcio Pea M.D.   On: 09/20/2021 07:12      Wrist imaging negative for acute  Patient is currently resting comfortably.  She is alert to voice.  Reports her headache feels much better.  She is comfortable plan to be discharged home, and appears well nontoxic well-appearing.  She is going to call for a ride to take her home, understands recommendation not to drive this morning where she remains feeling sleepy or tired from medication she was given earlier   Sharyn Creamer, MD 09/20/21 [phone]

## 2021-09-20 NOTE — ED Provider Notes (Signed)
[facility] Emergency Department Provider Note  ____________________________________________   Event Date/Time   First MD Initiated Contact with Patient 09/20/21 0543     (approximate)  I have reviewed the triage vital signs and the nursing notes.   HISTORY  Chief Complaint No chief complaint on file.    HPI Angelica Mason is a 25 y.o. female with history of ovarian cyst who presents to the emergency department with 2 separate complaints.  Reports over the past 2 weeks she has had constant throbbing headaches that are diffuse.  States that she will take 400 mg of Motrin and symptoms resolved but then she has to take Motrin at least 2-3 times a day to keep headaches at bay.  She states she has had history of migraines before and this feels similar but has never been this constant.  No head injury.  Not on blood thinners.  No fever, neck pain or neck stiffness.  No numbness, tingling or focal weakness.  States she will intermittently have blurry vision with these headaches but no vision loss, diplopia.  No nausea or vomiting.  Patient is also complaining of right wrist pain that is worse with abduction of the wrist and worse when she is picking up her 55-month-old child.  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  No injury that she can recall.  She has not seen an orthopedic physician for this.  Patient begins crying during history and examination.  Patient just had a baby several months ago and is taking care of him by herself.  She does have grandparents in this area but states that she does not feel that she has much emotional or physical support.  She does state that she is planning to go see her child's father tomorrow in [place] so that he can take care of the child for the next week so she can rest.  She denies any thoughts of wanting to harm herself or harm her child.  She states that she is thinking about moving to [place]  where she has friends that can help her and provide her with more support.  She states that things have been worse over the past few days because child has also been positive for RSV and has had to be in the emergency department himself.  She states neither of them have gotten any sleep over the past couple of days.        Past Medical History:  Diagnosis Date   Ovarian cyst     Patient Active Problem List   Diagnosis Date Noted   History of cesarean section    History of COVID-19 01/04/2021   Type A blood, Rh positive 01/04/2021   RLQ abdominal pain 12/22/2020    Past Surgical History:  Procedure Laterality Date   CESAREAN SECTION  02/26/2021   Procedure: CESAREAN SECTION;  Surgeon: Hildred Laser, MD;  Location: [facility] ORS;  Service: Obstetrics;;   NO PAST SURGERIES      Prior to Admission medications   Medication Sig Start Date End Date Taking? Authorizing Provider  meloxicam (MOBIC) 15 MG tablet Take 1 tablet (15 mg total) by mouth daily. 09/20/21 09/20/22 Yes Haleema Vanderheyden, Layla Maw, DO  acetaminophen (TYLENOL) 325 MG tablet Take 650 mg by mouth every 6 (six) hours as needed.    [provider]  ferrous sulfate 325 (65 FE) MG tablet Take 1 tablet (325 mg total) by mouth 2 (two) times  daily with a meal. 02/28/21   Doreene Burke, CNM  ibuprofen (ADVIL) 800 MG tablet Take 1 tablet (800 mg total) by mouth every 8 (eight) hours. 02/28/21   Doreene Burke, CNM  Prenatal Vit-Fe Fumarate-FA (PRENATAL MULTIVITAMIN) TABS tablet Take 1 tablet by mouth daily at 12 noon.    [provider]  tranexamic acid (LYSTEDA) 650 MG TABS tablet Take 2 tablets (1,300 mg total) by mouth 3 (three) times daily. Take during menses for a maximum of five days 07/26/21   Hildred Laser, MD    Allergies Toradol [ketorolac tromethamine]  Family History  Problem Relation Age of Onset   Cancer Neg Hx     Social History Social History   Tobacco Use   Smoking status: Never   Smokeless tobacco:  Never  Vaping Use   Vaping Use: Never used  Substance Use Topics   Alcohol use: Not Currently   Drug use: Not Currently    Review of Systems Constitutional: No fever. Eyes: + Blurry vision ENT: No sore throat. Cardiovascular: Denies chest pain. Respiratory: Denies shortness of breath. Gastrointestinal: No nausea, vomiting, diarrhea. Genitourinary: Negative for dysuria. Musculoskeletal: Negative for back pain. Skin: Negative for rash. Neurological: Negative for focal weakness or numbness.  ____________________________________________   PHYSICAL EXAM:  VITAL SIGNS: ED Triage Vitals  Enc Vitals Group     BP 09/20/21 0507 125/71     Pulse Rate 09/20/21 0507 77     Resp 09/20/21 0507 18     Temp 09/20/21 0507 98 F (36.7 C)     Temp Source 09/20/21 0507 Oral     SpO2 09/20/21 0507 97 %     Weight 09/20/21 0505 180 lb (81.6 kg)     Height 09/20/21 0505 5\' 3"  (1.6 m)     Head Circumference --      Peak Flow --      Pain Score 09/20/21 0505 8     Pain Loc --      Pain Edu? --      Excl. in GC? --    CONSTITUTIONAL: Alert and oriented and responds appropriately to questions. Well-appearing; well-nourished, afebrile, nontoxic HEAD: Normocephalic, atraumatic EYES: Conjunctivae clear, pupils appear equal, EOM appear intact ENT: normal nose; moist mucous membranes NECK: Supple, normal ROM, no meningismus CARD: RRR; S1 and S2 appreciated; no murmurs, no clicks, no rubs, no gallops RESP: Normal chest excursion without splinting or tachypnea; breath sounds clear and equal bilaterally; no wheezes, no rhonchi, no rales, no hypoxia or respiratory distress, speaking full sentences ABD/GI: Normal bowel sounds; non-distended; soft, non-tender, no rebound, no guarding, no peritoneal signs, no hepatosplenomegaly BACK: The back appears normal EXT: Normal ROM in all joints; no deformity noted, no edema; no cyanosis; + patient has tenderness to palpation over the radial aspect of the right  wrist with a positive Finkelstein's test.  2+ right radial pulse.  No bony tenderness or bony deformity.  No soft tissue swelling, ecchymosis, redness, warmth.  Normal capillary refill.  Normal sensation in the right upper extremity.  Compartments of the right arm are soft. SKIN: Normal color for age and race; warm; no rash on exposed skin NEURO: Moves all extremities equally, normal sensation diffusely, no facial asymmetry, normal speech PSYCH: Tearful.  Denies SI, thoughts of wanting to harm anyone else including her child.  ____________________________________________   LABS (all labs ordered are listed, but only abnormal results are displayed)  Labs Reviewed - No data to display ____________________________________________  EKG   ____________________________________________  RADIOLOGY I, Dalante Minus, personally viewed and evaluated these images (plain radiographs) as part of my medical decision making, as well as reviewing the written report by the radiologist.  ED MD interpretation: X-ray reviewed by myself shows no fracture or dislocation.  Official radiology report(s): DG Wrist Complete Right  Result Date: 09/20/2021 CLINICAL DATA:  Right wrist pain, atraumatic. EXAM: RIGHT WRIST - COMPLETE 3+ VIEW COMPARISON:  None. FINDINGS: There is no evidence of fracture or dislocation. There is no evidence of arthropathy or other focal bone abnormality. Soft tissues are unremarkable. IMPRESSION: Negative. Electronically Signed   By: Tiburcio Pea M.D.   On: 09/20/2021 07:12    ____________________________________________   PROCEDURES  Procedure(s) performed (including Critical Care):  Procedures   ____________________________________________   INITIAL IMPRESSION / ASSESSMENT AND PLAN / ED COURSE  As part of my medical decision making, I reviewed the following data within the electronic MEDICAL RECORD NUMBER Nursing notes reviewed and incorporated, Old chart reviewed, Radiograph  reviewed , and Notes from prior ED visits         Patient here with multiple complaints.  First she is complaining of frontal diffuse throbbing migraine headache.  Has history of similar headaches but this seems to be more frequent.  I suspect is exacerbated from stress and lack of sleep.  She did just have a baby about 7 months ago but I have low suspicion for cavernous sinus thrombosis.  Doubt intracranial hemorrhage, CVA, meningitis, encephalitis.  She states that this time she does not have anyone that can come watch her child or drive him home.  Discussed with patient that I can give her medications for her headache but I cannot give anything that would be a sedative given she has her 52-month-old with her.  She reports she is allergic to Toradol.  Will give IV fluids, Decadron, magnesium, Tylenol.  Patient is also complaining of right wrist pain.  I suspect that it is de Quervain's tenosynovitis from frequently picking up her child and working as a Scientist, research (medical).  She states she has a regular over-the-counter wrist splint but we will give her a thumb spica and have recommended close follow-up with orthopedics and will discharge with prescription for Mobic.  Recommended rest, elevation, ice.  No injury to suggest fracture but will obtain x-ray while she is here in the ED.  No sign of gout, septic arthritis, DVT, arterial obstruction, compartment syndrome, cellulitis, abscess on exam.  Patient also intermittently tearful here and seems as if she is tired and overwhelmed.  She feels like she does not have much support from family, friends.  I suspect that there may be some postpartum depression present but she denies any SI, HI and I do not feel she needs emergent psychiatric evaluation but will give close outpatient follow-up as I feel she would likely benefit from having someone to talk to.  ED PROGRESS  She was able to get in touch with family member who will pick up her son and take him to her  house.  Once family member arrives, will order Compazine, Benadryl.  7:13 AM  Pt reports migraine cocktail is already beginning to help her pain significantly.  X-ray of the right wrist shows no acute abnormality.  Patient states that there is no way that she is pregnant today and declines a pregnancy test.  Anticipate discharge home with family member after IV fluids complete.  Discharged with prescription of Mobic that I think will help control her headaches as well as  her wrist pain.  Placed in a thumb spica and will give outpatient PCP and orthopedic follow-up.  At this time, I do not feel there is any life-threatening condition present. I have reviewed, interpreted and discussed all results (EKG, imaging, lab, urine as appropriate) and exam findings with patient/family. I have reviewed nursing notes and appropriate previous records.  I feel the patient is safe to be discharged home without further emergent workup and can continue workup as an outpatient as needed. Discussed usual and customary return precautions. Patient/family verbalize understanding and are comfortable with this plan.  Outpatient follow-up has been provided as needed. All questions have been answered.  ____________________________________________   FINAL CLINICAL IMPRESSION(S) / ED DIAGNOSES  Final diagnoses:  Migraine without aura and without status migrainosus, not intractable  De Quervain's tenosynovitis, right     ED Discharge Orders          Ordered    meloxicam (MOBIC) 15 MG tablet  Daily        09/20/21 1324            *Please note:  Angelica Mason was evaluated in Emergency Department on 09/20/2021 for the symptoms described in the history of present illness. She was evaluated in the context of the global COVID-19 pandemic, which necessitated consideration that the patient might be at risk for infection with the SARS-CoV-2 virus that causes COVID-19. Institutional protocols and algorithms that pertain  to the evaluation of patients at risk for COVID-19 are in a state of rapid change based on information released by regulatory bodies including the CDC and federal and state organizations. These policies and algorithms were followed during the patient's care in the ED.  Some ED evaluations and interventions may be delayed as a result of limited staffing during and the pandemic.*   Note:  This document was prepared using Dragon voice recognition software and may include unintentional dictation errors.    Aerika Groll, Layla Maw, DO 09/20/21 [phone]

## 2021-09-20 NOTE — ED Triage Notes (Addendum)
Patient ambulatory to triage with steady gait, without difficulty or distress noted; pt reports rt wrist pain for several wks with no known injury and generalized headache for 2wks; st pain unrelieved by motrin

## 2021-09-23 ENCOUNTER — Telehealth: Payer: Self-pay

## 2021-09-23 NOTE — Telephone Encounter (Signed)
Transition Care Management Unsuccessful Follow-up Telephone Call  Date of discharge and from where:  09/20/2021-[facility]  Attempts:  1st Attempt  Reason for unsuccessful TCM follow-up call:  Left voice message

## 2021-09-24 NOTE — Telephone Encounter (Signed)
Transition Care Management Follow-up Telephone Call Date of discharge and from where: 09/20/2021-[facility] How have you been since you were released from the hospital? Patient stated she is doing fine. Any questions or concerns? No  Items Reviewed: Did the pt receive and understand the discharge instructions provided? Yes  Medications obtained and verified? Yes  Other? No  Any new allergies since your discharge? No  Dietary orders reviewed? No Do you have support at home? Yes   Home Care and Equipment/Supplies: Were home health services ordered? not applicable If so, what is the name of the agency? N/A  Has the agency set up a time to come to the patient's home? not applicable Were any new equipment or medical supplies ordered?  No What is the name of the medical supply agency? N/A Were you able to get the supplies/equipment? not applicable Do you have any questions related to the use of the equipment or supplies? No  Functional Questionnaire: (I = Independent and D = Dependent) ADLs: I  Bathing/Dressing- I  Meal Prep- I  Eating- I  Maintaining continence- I  Transferring/Ambulation- I  Managing Meds- I  Follow up appointments reviewed:  PCP Hospital f/u appt confirmed? No   Specialist Hospital f/u appt confirmed? No   Are transportation arrangements needed? No  If their condition worsens, is the pt aware to call PCP or go to the Emergency Dept.? Yes Was the patient provided with contact information for the PCP's office or ED? Yes Was to pt encouraged to call back with questions or concerns? Yes

## 2021-10-08 DIAGNOSIS — Z419 Encounter for procedure for purposes other than remedying health state, unspecified: Secondary | ICD-10-CM | POA: Diagnosis not present

## 2021-11-07 DIAGNOSIS — Z419 Encounter for procedure for purposes other than remedying health state, unspecified: Secondary | ICD-10-CM | POA: Diagnosis not present

## 2021-11-27 DIAGNOSIS — B9689 Other specified bacterial agents as the cause of diseases classified elsewhere: Secondary | ICD-10-CM | POA: Diagnosis not present

## 2021-11-27 DIAGNOSIS — Z1159 Encounter for screening for other viral diseases: Secondary | ICD-10-CM | POA: Diagnosis not present

## 2021-11-27 DIAGNOSIS — Z113 Encounter for screening for infections with a predominantly sexual mode of transmission: Secondary | ICD-10-CM | POA: Diagnosis not present

## 2021-12-06 DIAGNOSIS — R072 Precordial pain: Secondary | ICD-10-CM | POA: Diagnosis not present

## 2021-12-06 DIAGNOSIS — R0789 Other chest pain: Secondary | ICD-10-CM | POA: Diagnosis not present

## 2021-12-06 DIAGNOSIS — Z20822 Contact with and (suspected) exposure to covid-19: Secondary | ICD-10-CM | POA: Diagnosis not present

## 2021-12-06 DIAGNOSIS — R079 Chest pain, unspecified: Secondary | ICD-10-CM | POA: Diagnosis not present

## 2021-12-08 DIAGNOSIS — Z419 Encounter for procedure for purposes other than remedying health state, unspecified: Secondary | ICD-10-CM | POA: Diagnosis not present

## 2022-01-08 DIAGNOSIS — Z419 Encounter for procedure for purposes other than remedying health state, unspecified: Secondary | ICD-10-CM | POA: Diagnosis not present

## 2022-02-05 DIAGNOSIS — Z419 Encounter for procedure for purposes other than remedying health state, unspecified: Secondary | ICD-10-CM | POA: Diagnosis not present

## 2022-03-03 IMAGING — US US PELVIS COMPLETE WITH TRANSVAGINAL
2 series · 14 of 25 positions shown · non-contrast
Comparison: None

CLINICAL DATA: Abnormal vaginal bleeding



[Series 1: us pelvic complete with transvaginal · 13 of 40 slices shown]
[im 1/40]
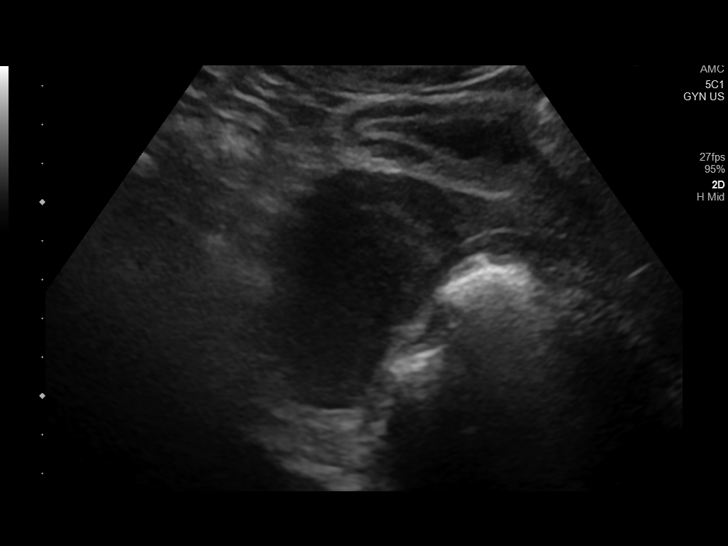
[im 4/40]
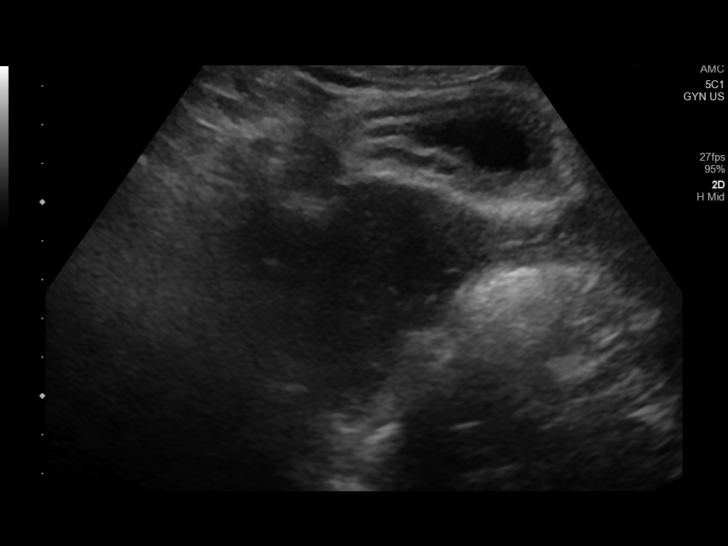
[im 7/40]
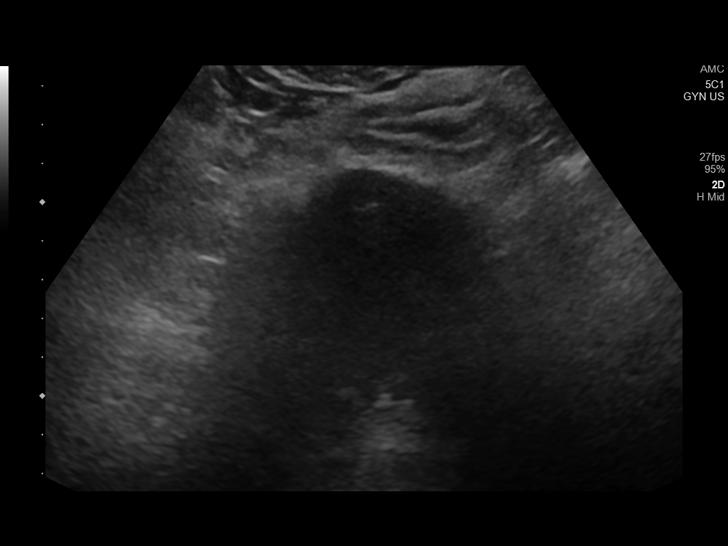
[im 11/40]
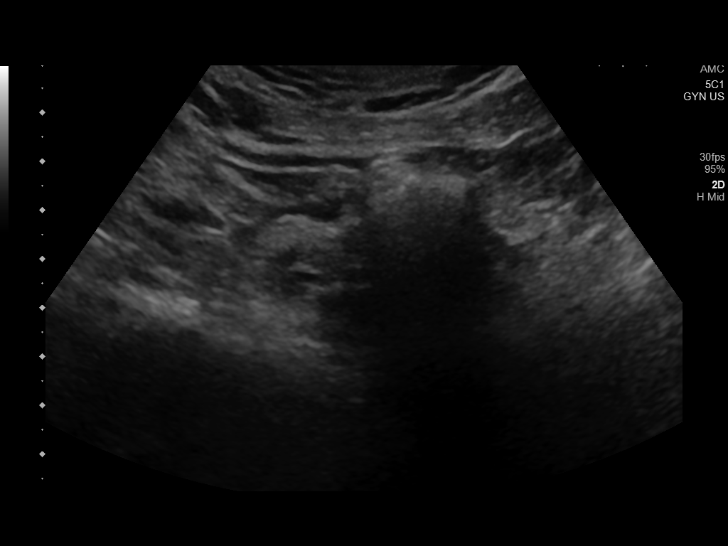
[im 14/40]
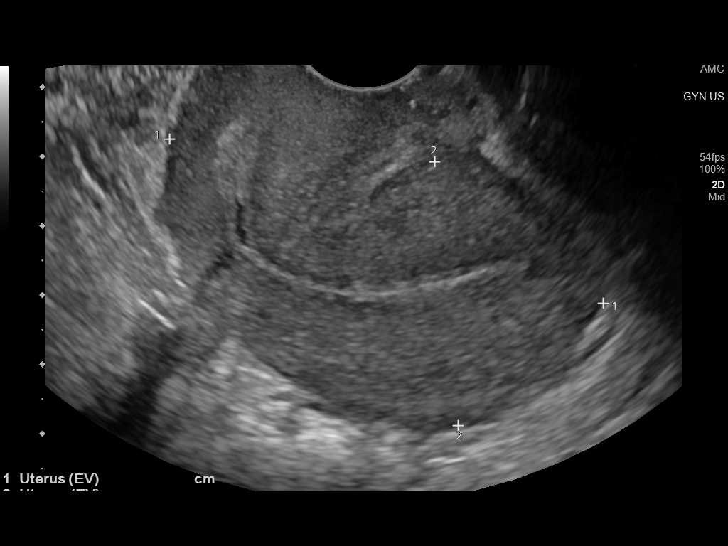
[im 16/40]
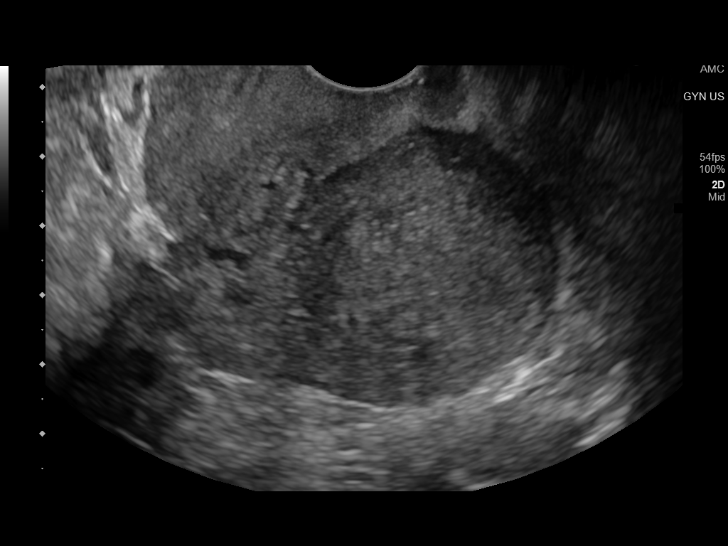
[im 19/40]
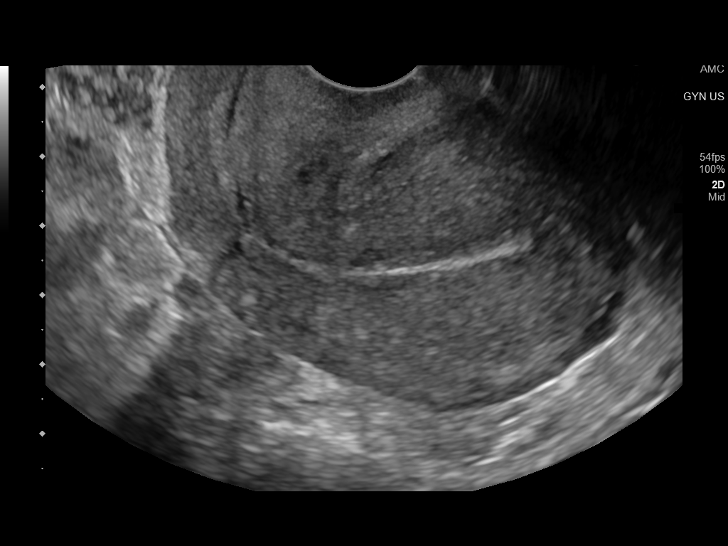
[im 23/40]
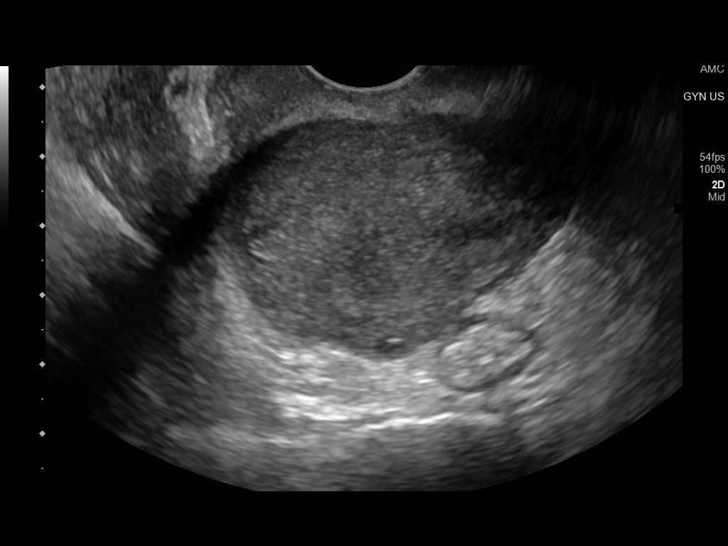
[im 26/40]
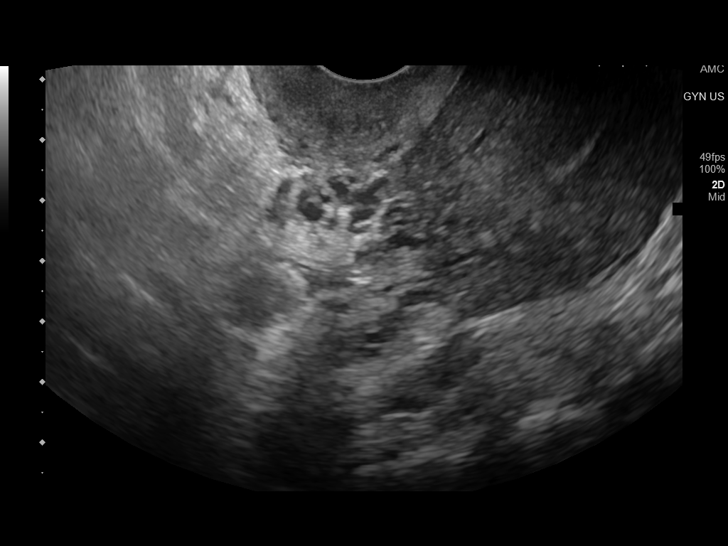
[im 28/40]
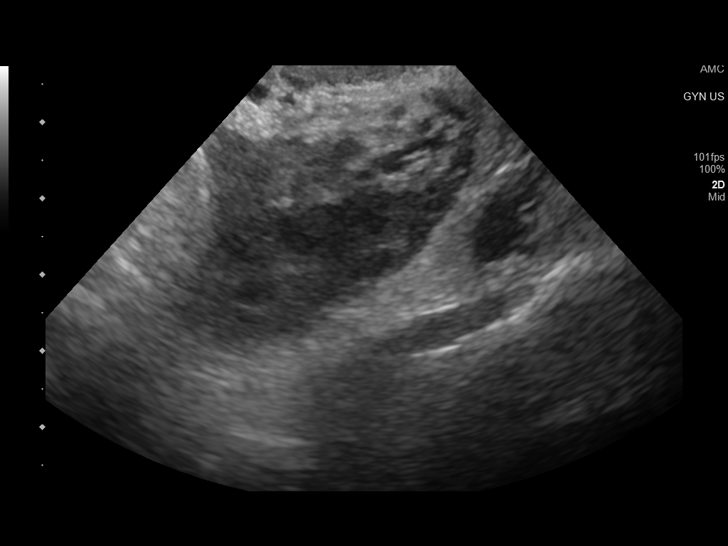
[im 31/40]
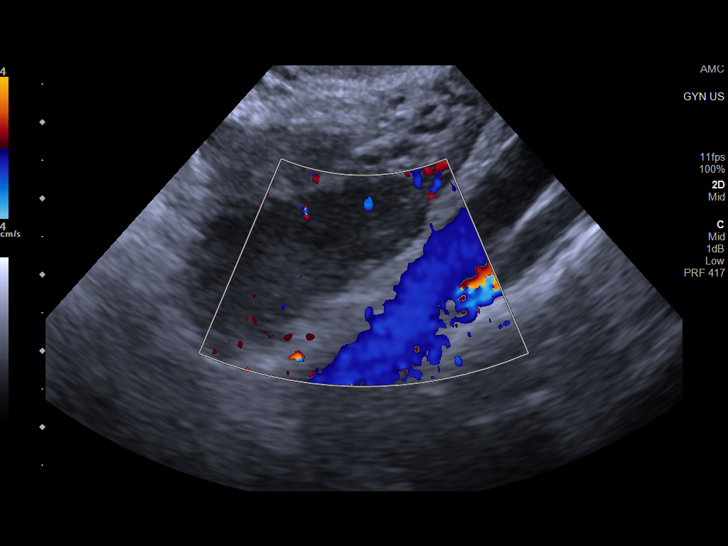
[im 34/40]
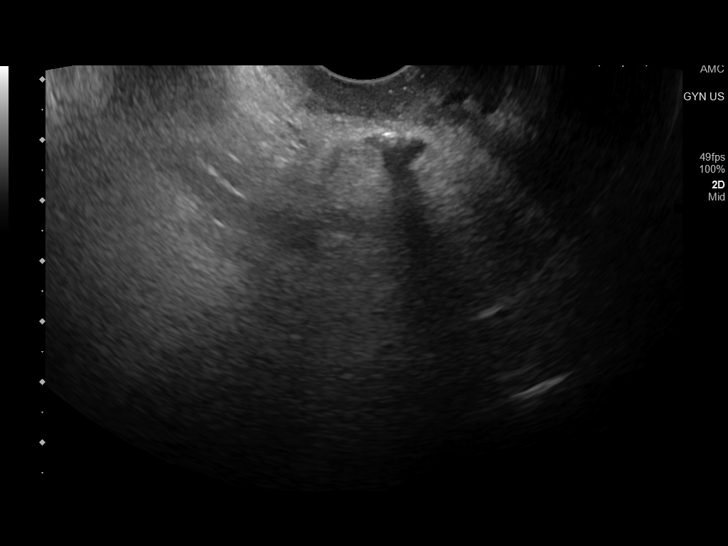
[im 38/40]
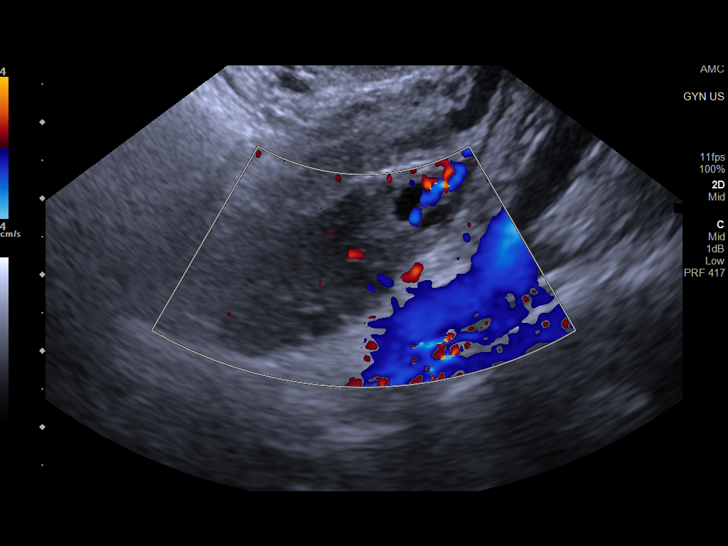

[Series 1001: gyn us · 1 of 1 slices shown]
[im 1/1]
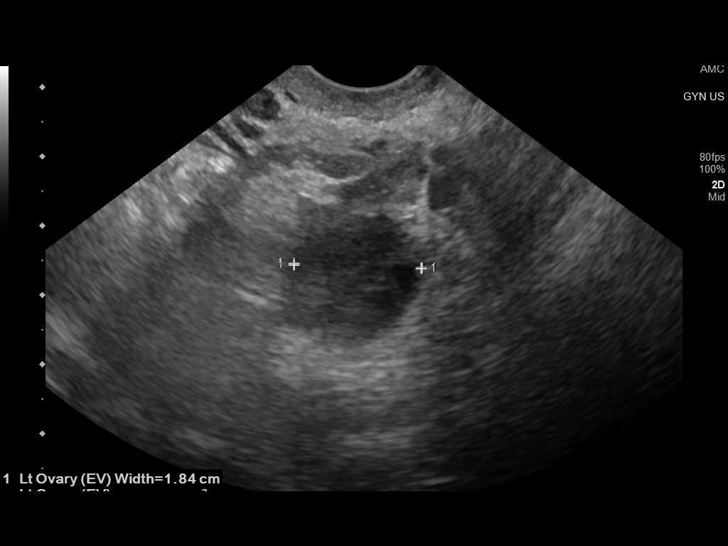

[14 of 25 positions shown; findings below may reference images not displayed]

FINDINGS: Uterus

Measurements: 6.7 x 3.8 x 4.5 cm. = volume: 60 mL. No fibroids or
other mass visualized.

Endometrium

Thickness: 3.6 mm.  No focal abnormality visualized.

Right ovary

Measurements: 3.1 x 1.2 x 1.7 cm. = volume: 3.5 mL. Normal
appearance/no adnexal mass.

Left ovary

Measurements: 3.5 x 1.4 x 1.8 cm = volume: 6.2 mL. Normal
appearance/no adnexal mass.

Other findings

No abnormal free fluid.
IMPRESSION: No acute abnormality noted.

## 2022-03-08 DIAGNOSIS — Z419 Encounter for procedure for purposes other than remedying health state, unspecified: Secondary | ICD-10-CM | POA: Diagnosis not present

## 2022-04-07 DIAGNOSIS — Z419 Encounter for procedure for purposes other than remedying health state, unspecified: Secondary | ICD-10-CM | POA: Diagnosis not present

## 2022-05-08 DIAGNOSIS — Z419 Encounter for procedure for purposes other than remedying health state, unspecified: Secondary | ICD-10-CM | POA: Diagnosis not present

## 2022-06-07 DIAGNOSIS — Z419 Encounter for procedure for purposes other than remedying health state, unspecified: Secondary | ICD-10-CM | POA: Diagnosis not present

## 2022-07-08 DIAGNOSIS — Z419 Encounter for procedure for purposes other than remedying health state, unspecified: Secondary | ICD-10-CM | POA: Diagnosis not present

## 2022-08-08 DIAGNOSIS — Z419 Encounter for procedure for purposes other than remedying health state, unspecified: Secondary | ICD-10-CM | POA: Diagnosis not present

## 2022-09-07 DIAGNOSIS — Z419 Encounter for procedure for purposes other than remedying health state, unspecified: Secondary | ICD-10-CM | POA: Diagnosis not present

## 2022-10-08 DIAGNOSIS — Z419 Encounter for procedure for purposes other than remedying health state, unspecified: Secondary | ICD-10-CM | POA: Diagnosis not present

## 2022-11-07 DIAGNOSIS — Z419 Encounter for procedure for purposes other than remedying health state, unspecified: Secondary | ICD-10-CM | POA: Diagnosis not present

## 2022-12-08 DIAGNOSIS — Z419 Encounter for procedure for purposes other than remedying health state, unspecified: Secondary | ICD-10-CM | POA: Diagnosis not present

## 2023-01-08 DIAGNOSIS — Z419 Encounter for procedure for purposes other than remedying health state, unspecified: Secondary | ICD-10-CM | POA: Diagnosis not present

## 2023-02-06 DIAGNOSIS — Z419 Encounter for procedure for purposes other than remedying health state, unspecified: Secondary | ICD-10-CM | POA: Diagnosis not present

## 2023-04-07 ENCOUNTER — Telehealth: Payer: Self-pay

## 2023-04-07 NOTE — Telephone Encounter (Signed)
LVM for patient to call back. AS, CMA
